# Patient Record
Sex: Female | Born: 1965 | Race: White | Hispanic: No | Marital: Married | Smoking: Never smoker
Health system: Southern US, Community
[De-identification: ages and names within clinical notes are randomized; demographics above are authoritative.]

## PROBLEM LIST (undated history)

## (undated) DIAGNOSIS — C4490 Unspecified malignant neoplasm of skin, unspecified: Secondary | ICD-10-CM

## (undated) HISTORY — PX: TUBAL LIGATION: SHX77

## (undated) HISTORY — PX: TONSILLECTOMY: SUR1361

---

## 2006-01-13 ENCOUNTER — Ambulatory Visit: Payer: Self-pay | Admitting: General Practice

## 2009-01-24 ENCOUNTER — Ambulatory Visit: Payer: Self-pay | Admitting: General Practice

## 2018-10-12 ENCOUNTER — Encounter: Payer: Self-pay | Admitting: Certified Nurse Midwife

## 2018-10-12 ENCOUNTER — Ambulatory Visit (INDEPENDENT_AMBULATORY_CARE_PROVIDER_SITE_OTHER): Payer: BLUE CROSS/BLUE SHIELD | Admitting: Certified Nurse Midwife

## 2018-10-12 ENCOUNTER — Other Ambulatory Visit (HOSPITAL_COMMUNITY)
Admission: RE | Admit: 2018-10-12 | Discharge: 2018-10-12 | Disposition: A | Discharge status: Home / Self Care | Payer: BLUE CROSS/BLUE SHIELD | Source: Ambulatory Visit | Attending: Certified Nurse Midwife | Admitting: Certified Nurse Midwife

## 2018-10-12 VITALS — BP 132/90 | HR 75 | Ht 70.0 in | Wt 227.2 lb

## 2018-10-12 DIAGNOSIS — Z1211 Encounter for screening for malignant neoplasm of colon: Secondary | ICD-10-CM

## 2018-10-12 DIAGNOSIS — Z124 Encounter for screening for malignant neoplasm of cervix: Secondary | ICD-10-CM | POA: Diagnosis not present

## 2018-10-12 DIAGNOSIS — Z1239 Encounter for other screening for malignant neoplasm of breast: Secondary | ICD-10-CM | POA: Diagnosis not present

## 2018-10-12 DIAGNOSIS — Z01419 Encounter for gynecological examination (general) (routine) without abnormal findings: Secondary | ICD-10-CM | POA: Diagnosis present

## 2018-10-12 DIAGNOSIS — Z6832 Body mass index (BMI) 32.0-32.9, adult: Secondary | ICD-10-CM

## 2018-10-12 DIAGNOSIS — R102 Pelvic and perineal pain unspecified side: Secondary | ICD-10-CM

## 2018-10-12 NOTE — Patient Instructions (Signed)
Preventive Care 40-64 Years, Female Preventive care refers to lifestyle choices and visits with your health care provider that can promote health and wellness. What does preventive care include?  A yearly physical exam. This is also called an annual well check.  Dental exams once or twice a year.  Routine eye exams. Ask your health care provider how often you should have your eyes checked.  Personal lifestyle choices, including: ? Daily care of your teeth and gums. ? Regular physical activity. ? Eating a healthy diet. ? Avoiding tobacco and drug use. ? Limiting alcohol use. ? Practicing safe sex. ? Taking low-dose aspirin daily starting at age 58. ? Taking vitamin and mineral supplements as recommended by your health care provider. What happens during an annual well check? The services and screenings done by your health care provider during your annual well check will depend on your age, overall health, lifestyle risk factors, and family history of disease. Counseling Your health care provider may ask you questions about your:  Alcohol use.  Tobacco use.  Drug use.  Emotional well-being.  Home and relationship well-being.  Sexual activity.  Eating habits.  Work and work Statistician.  Method of birth control.  Menstrual cycle.  Pregnancy history.  Screening You may have the following tests or measurements:  Height, weight, and BMI.  Blood pressure.  Lipid and cholesterol levels. These may be checked every 5 years, or more frequently if you are over 81 years old.  Skin check.  Lung cancer screening. You may have this screening every year starting at age 78 if you have a 30-pack-year history of smoking and currently smoke or have quit within the past 15 years.  Fecal occult blood test (FOBT) of the stool. You may have this test every year starting at age 65.  Flexible sigmoidoscopy or colonoscopy. You may have a sigmoidoscopy every 5 years or a colonoscopy  every 10 years starting at age 30.  Hepatitis C blood test.  Hepatitis B blood test.  Sexually transmitted disease (STD) testing.  Diabetes screening. This is done by checking your blood sugar (glucose) after you have not eaten for a while (fasting). You may have this done every 1-3 years.  Mammogram. This may be done every 1-2 years. Talk to your health care provider about when you should start having regular mammograms. This may depend on whether you have a family history of breast cancer.  BRCA-related cancer screening. This may be done if you have a family history of breast, ovarian, tubal, or peritoneal cancers.  Pelvic exam and Pap test. This may be done every 3 years starting at age 80. Starting at age 36, this may be done every 5 years if you have a Pap test in combination with an HPV test.  Bone density scan. This is done to screen for osteoporosis. You may have this scan if you are at high risk for osteoporosis.  Discuss your test results, treatment options, and if necessary, the need for more tests with your health care provider. Vaccines Your health care provider may recommend certain vaccines, such as:  Influenza vaccine. This is recommended every year.  Tetanus, diphtheria, and acellular pertussis (Tdap, Td) vaccine. You may need a Td booster every 10 years.  Varicella vaccine. You may need this if you have not been vaccinated.  Zoster vaccine. You may need this after age 5.  Measles, mumps, and rubella (MMR) vaccine. You may need at least one dose of MMR if you were born in  1957 or later. You may also need a second dose.  Pneumococcal 13-valent conjugate (PCV13) vaccine. You may need this if you have certain conditions and were not previously vaccinated.  Pneumococcal polysaccharide (PPSV23) vaccine. You may need one or two doses if you smoke cigarettes or if you have certain conditions.  Meningococcal vaccine. You may need this if you have certain  conditions.  Hepatitis A vaccine. You may need this if you have certain conditions or if you travel or work in places where you may be exposed to hepatitis A.  Hepatitis B vaccine. You may need this if you have certain conditions or if you travel or work in places where you may be exposed to hepatitis B.  Haemophilus influenzae type b (Hib) vaccine. You may need this if you have certain conditions.  Talk to your health care provider about which screenings and vaccines you need and how often you need them. This information is not intended to replace advice given to you by your health care provider. Make sure you discuss any questions you have with your health care provider. Document Released: 01/04/2016 Document Revised: 08/27/2016 Document Reviewed: 10/09/2015 Elsevier Interactive Patient Education  2018 Elsevier Inc.  

## 2018-10-12 NOTE — Progress Notes (Signed)
ANNUAL PREVENTATIVE CARE GYN  ENCOUNTER NOTE  Subjective:       Indy Prestwood is a 52 y.o. G39P1001 female here for a routine annual gynecologic exam.  Current complaints: 1. Needs Pap smear 2. Needs mammogram 3. Need colonoscopy 4. Vaginal pressure and feeling of incomplete bladder empty for the last two years  Denies difficulty breathing or respiratory distress, chest pain, abdominal pain, excessive vaginal bleeding, dysuria, and leg pain or swelling.    Gynecologic History  Patient's last menstrual period was 09/29/2018 (exact date).  Contraception: bilateral tubal ligation  Last Pap: 2014. Results were: normal  Last mammogram: 2007. Results were: normal  Last colonoscopy: due.   Obstetric History  OB History  Gravida Para Term Preterm AB Living  1 1 1     1   SAB TAB Ectopic Multiple Live Births          1    # Outcome Date GA Lbr Len/2nd Weight Sex Delivery Anes PTL Lv  1 Term 06/09/88    F Vag-Spont   LIV    No past medical history on file.   Current Outpatient Medications on File Prior to Visit  Medication Sig Dispense Refill  . Multiple Vitamin (MULTIVITAMIN) tablet Take 1 tablet by mouth daily.     No current facility-administered medications on file prior to visit.     No Known Allergies  Social History   Socioeconomic History  . Marital status: Married    Spouse name: Not on file  . Number of children: Not on file  . Years of education: Not on file  . Highest education level: Not on file  Occupational History  . Not on file  Social Needs  . Financial resource strain: Not on file  . Food insecurity:    Worry: Not on file    Inability: Not on file  . Transportation needs:    Medical: Not on file    Non-medical: Not on file  Tobacco Use  . Smoking status: Never Smoker  . Smokeless tobacco: Never Used  Substance and Sexual Activity  . Alcohol use: Yes    Alcohol/week: 3.0 standard drinks    Types: 2 Glasses of wine, 1 Shots of liquor per  week  . Drug use: Never  . Sexual activity: Yes    Birth control/protection: Surgical  Lifestyle  . Physical activity:    Days per week: Not on file    Minutes per session: Not on file  . Stress: Not on file  Relationships  . Social connections:    Talks on phone: Not on file    Gets together: Not on file    Attends religious service: Not on file    Active member of club or organization: Not on file    Attends meetings of clubs or organizations: Not on file    Relationship status: Not on file  . Intimate partner violence:    Fear of current or ex partner: Not on file    Emotionally abused: Not on file    Physically abused: Not on file    Forced sexual activity: Not on file  Other Topics Concern  . Not on file  Social History Narrative  . Not on file    Family History  Problem Relation Age of Onset  . Breast cancer Neg Hx   . Ovarian cancer Neg Hx   . Colon cancer Neg Hx     The following portions of the patient's history were reviewed and updated as  appropriate: allergies, current medications, past family history, past medical history, past social history, past surgical history and problem list.  Review of Systems  ROS negative except as noted above. Information obtained from patient.    Objective:   BP 132/90   Pulse 75   Ht 5\' 10"  (1.778 m)   Wt 227 lb 3.2 oz (103.1 kg)   LMP 09/29/2018 (Exact Date)   BMI 32.60 kg/m   CONSTITUTIONAL: Well-developed, well-nourished female in no acute distress.   PSYCHIATRIC: Normal mood and affect. Normal behavior. Normal judgment and thought content.  NEUROLGIC: Alert and oriented to person, place, and time. Normal muscle tone coordination. No cranial nerve deficit noted.  HENT:  Normocephalic, atraumatic, External right and left ear normal. Oropharynx is clear and moist  EYES: Conjunctivae and EOM are normal. Pupils are equal and round.   NECK: Normal range of motion, supple, no masses.  Normal thyroid.   SKIN: Skin  is warm and dry. No rash noted. Not diaphoretic. No erythema. No pallor.  CARDIOVASCULAR: Normal heart rate noted, regular rhythm, no murmur.  RESPIRATORY: Clear to auscultation bilaterally. Effort and breath sounds normal, no problems with respiration noted.  BREASTS: Symmetric in size. No masses, skin changes, nipple drainage, or lymphadenopathy.  ABDOMEN: Soft, normal bowel sounds, no distention noted.  No tenderness, rebound or guarding. Obese.   PELVIC:  External Genitalia: Normal  Vagina: Normal  Cervix: Normal  Uterus: Normal  Adnexa: Normal  RV: External Exam NormaI, No Rectal Masses and Normal Sphincter tone    MUSCULOSKELETAL: Normal range of motion. No tenderness.  No cyanosis, clubbing, or edema.  2+ distal pulses.  LYMPHATIC: No Axillary, Supraclavicular, or Inguinal Adenopathy.  Assessment:   Annual gynecologic examination 52 y.o.   Contraception: bilateral tubal ligation   Obesity 1   Problem List Items Addressed This Visit    None    Visit Diagnoses    Well woman exam    -  Primary   Relevant Orders   TSH   CBC   Comprehensive metabolic panel   Lipid panel   Hemoglobin A1c   Cytology - PAP   Ambulatory referral to Gastroenterology   MM 3D SCREEN BREAST BILATERAL   Screening for cervical cancer       Relevant Orders   Cytology - PAP   Colon cancer screening       Relevant Orders   Ambulatory referral to Gastroenterology   Breast cancer screening       Relevant Orders   MM 3D SCREEN BREAST BILATERAL   Pelvic pressure in female       BMI 32.0-32.9,adult       Relevant Orders   TSH   Lipid panel   Hemoglobin A1c      Plan:   Pap: Pap Co Test  Mammogram: Ordered  Colonoscopy: referral to GI, see orders.   Labs: See orders   Routine preventative health maintenance measures emphasized: Exercise/Diet/Weight control, Tobacco Warnings, Alcohol/Substance use risks and Stress Management; see AVS  Discussed home vaginal health and bladder  management techniques. Encouraged exercises and pelvic floor therapist as needed.   Reviewed red flag symptoms and when to call.   RTC for blood pressure check. Nervous today.   RTC x 1 year for ANNUAL EXAM or sooner if needed.    Gunnar Bulla, CNM [facility], [facility] 10/12/18 10:39 AM

## 2018-10-12 NOTE — Progress Notes (Signed)
Patient c/o constant vaginal pressure x2 years, feels like she does not fully empty bladder, denies dysuria.

## 2018-10-13 LAB — CBC
HEMOGLOBIN: 13 g/dL (ref 11.1–15.9)
Hematocrit: 37.6 % (ref 34.0–46.6)
MCH: 31.6 pg (ref 26.6–33.0)
MCHC: 34.6 g/dL (ref 31.5–35.7)
MCV: 92 fL (ref 79–97)
PLATELETS: 310 10*3/uL (ref 150–450)
RBC: 4.11 x10E6/uL (ref 3.77–5.28)
RDW: 13.1 % (ref 12.3–15.4)
WBC: 6.4 10*3/uL (ref 3.4–10.8)

## 2018-10-13 LAB — LIPID PANEL
Chol/HDL Ratio: 3.1 ratio (ref 0.0–4.4)
Cholesterol, Total: 194 mg/dL (ref 100–199)
HDL: 63 mg/dL (ref >39)
LDL Calculated: 115 mg/dL — ABNORMAL HIGH (ref 0–99)
TRIGLYCERIDES: 82 mg/dL (ref 0–149)
VLDL Cholesterol Cal: 16 mg/dL (ref 5–40)

## 2018-10-13 LAB — COMPREHENSIVE METABOLIC PANEL
ALT: 13 IU/L (ref 0–32)
AST: 17 IU/L (ref 0–40)
Albumin/Globulin Ratio: 1.7 (ref 1.2–2.2)
Albumin: 4.3 g/dL (ref 3.5–5.5)
Alkaline Phosphatase: 62 IU/L (ref 39–117)
BUN/Creatinine Ratio: 10 (ref 9–23)
BUN: 10 mg/dL (ref 6–24)
Bilirubin Total: 0.4 mg/dL (ref 0.0–1.2)
CO2: 22 mmol/L (ref 20–29)
Calcium: 9 mg/dL (ref 8.7–10.2)
Chloride: 103 mmol/L (ref 96–106)
Creatinine, Ser: 1 mg/dL (ref 0.57–1.00)
GFR calc Af Amer: 75 mL/min/{1.73_m2} (ref >59)
GFR, EST NON AFRICAN AMERICAN: 65 mL/min/{1.73_m2} (ref >59)
GLUCOSE: 90 mg/dL (ref 65–99)
Globulin, Total: 2.5 g/dL (ref 1.5–4.5)
POTASSIUM: 4.2 mmol/L (ref 3.5–5.2)
Sodium: 140 mmol/L (ref 134–144)
Total Protein: 6.8 g/dL (ref 6.0–8.5)

## 2018-10-13 LAB — HEMOGLOBIN A1C
Est. average glucose Bld gHb Est-mCnc: 103 mg/dL
Hgb A1c MFr Bld: 5.2 % (ref 4.8–5.6)

## 2018-10-13 LAB — TSH: TSH: 3.53 u[IU]/mL (ref 0.450–4.500)

## 2018-10-14 ENCOUNTER — Other Ambulatory Visit: Payer: Self-pay

## 2018-10-14 DIAGNOSIS — Z1211 Encounter for screening for malignant neoplasm of colon: Secondary | ICD-10-CM

## 2018-10-14 NOTE — Progress Notes (Signed)
Please contact patient. All labs normal. LDL slightly elevated. Recommend low cholesterol diet. Will contact when Pap results available. Encourage to activate MyChart. Schdule blood pressure check. Thanks, JML

## 2018-10-15 LAB — CYTOLOGY - PAP
DIAGNOSIS: NEGATIVE
HPV (WINDOPATH): NOT DETECTED

## 2018-10-18 NOTE — Progress Notes (Signed)
Please contact patient Pap negative for intraepithelial lesions and malignancy or normal. Negative for HPV as well. Next Pap in 5 years or sooner if desired. Encourage to activate MyChart. Thanks, JML

## 2018-11-11 ENCOUNTER — Ambulatory Visit: Payer: BLUE CROSS/BLUE SHIELD | Admitting: Certified Registered Nurse Anesthetist

## 2018-11-11 ENCOUNTER — Encounter
Admission: RE | Disposition: A | Discharge status: Home / Self Care | Payer: Self-pay | Source: Ambulatory Visit | Attending: Gastroenterology

## 2018-11-11 ENCOUNTER — Encounter: Payer: Self-pay | Admitting: Certified Registered Nurse Anesthetist

## 2018-11-11 ENCOUNTER — Ambulatory Visit
Admission: RE | Admit: 2018-11-11 | Discharge: 2018-11-11 | Disposition: A | Discharge status: Home / Self Care | Payer: BLUE CROSS/BLUE SHIELD | Source: Ambulatory Visit | Attending: Gastroenterology | Admitting: Gastroenterology

## 2018-11-11 DIAGNOSIS — Z1211 Encounter for screening for malignant neoplasm of colon: Secondary | ICD-10-CM

## 2018-11-11 DIAGNOSIS — K5730 Diverticulosis of large intestine without perforation or abscess without bleeding: Secondary | ICD-10-CM | POA: Diagnosis not present

## 2018-11-11 DIAGNOSIS — Z79899 Other long term (current) drug therapy: Secondary | ICD-10-CM | POA: Diagnosis not present

## 2018-11-11 HISTORY — PX: COLONOSCOPY WITH PROPOFOL: SHX5780

## 2018-11-11 LAB — POCT PREGNANCY, URINE: Preg Test, Ur: NEGATIVE

## 2018-11-11 SURGERY — COLONOSCOPY WITH PROPOFOL
Anesthesia: General

## 2018-11-11 MED ORDER — PROPOFOL 500 MG/50ML IV EMUL
INTRAVENOUS | Status: DC | PRN
  Administered 2018-11-11: 175 ug/kg/min via INTRAVENOUS

## 2018-11-11 MED ORDER — LIDOCAINE HCL (CARDIAC) PF 100 MG/5ML IV SOSY
PREFILLED_SYRINGE | INTRAVENOUS | Status: DC | PRN
  Administered 2018-11-11: 50 mg via INTRAVENOUS

## 2018-11-11 MED ORDER — SODIUM CHLORIDE 0.9 % IV SOLN
INTRAVENOUS | Status: DC
  Administered 2018-11-11: 1000 mL via INTRAVENOUS

## 2018-11-11 MED ORDER — LIDOCAINE HCL (PF) 2 % IJ SOLN
INTRAMUSCULAR | Status: AC
  Filled 2018-11-11: qty 10

## 2018-11-11 MED ORDER — PROPOFOL 10 MG/ML IV BOLUS
INTRAVENOUS | Status: DC | PRN
  Administered 2018-11-11: 20 mg via INTRAVENOUS
  Administered 2018-11-11: 80 mg via INTRAVENOUS

## 2018-11-11 NOTE — Anesthesia Post-op Follow-up Note (Signed)
Anesthesia QCDR form completed.        

## 2018-11-11 NOTE — Anesthesia Postprocedure Evaluation (Signed)
Anesthesia Post Note  Patient: Morgan Summers  Procedure(s) Performed: COLONOSCOPY WITH PROPOFOL (N/A )  Patient location during evaluation: Endoscopy Anesthesia Type: General Level of consciousness: awake and alert Pain management: pain level controlled Vital Signs Assessment: post-procedure vital signs reviewed and stable Respiratory status: spontaneous breathing, nonlabored ventilation, respiratory function stable and patient connected to nasal cannula oxygen Cardiovascular status: blood pressure returned to baseline and stable Postop Assessment: no apparent nausea or vomiting Anesthetic complications: no     Last Vitals:  Vitals:   11/11/18 0854 11/11/18 0924  BP: 104/64 118/76  Pulse: 72   Resp: 13   Temp: (!) 36.3 C   SpO2: 97%     Last Pain:  Vitals:   11/11/18 0924  TempSrc:   PainSc: 0-No pain                 Cleda MccreedyJoseph K Piscitello

## 2018-11-11 NOTE — Anesthesia Procedure Notes (Signed)
Date/Time: 11/11/2018 8:37 AM Performed by: Ginger CarneMichelet, Perian Tedder, CRNA Pre-anesthesia Checklist: Patient identified, Emergency Drugs available, Suction available, Patient being monitored and Timeout performed Patient Re-evaluated:Patient Re-evaluated prior to induction Oxygen Delivery Method: Nasal cannula Preoxygenation: Pre-oxygenation with 100% oxygen Induction Type: IV induction

## 2018-11-11 NOTE — H&P (Signed)
Arlyss Repressohini R , MD [address]  [address]  [place], [place] [address]  Main: [phone]  Fax: [phone] Pager: [phone]  Primary Care Physician:  System, Pcp Not In Primary Gastroenterologist:  Dr. Arlyss Repressohini R   Pre-Procedure History & Physical: HPI:  Morgan Summers is a 52 y.o. female is here for an colonoscopy.   History reviewed. No pertinent past medical history.  Past Surgical History:  Procedure Laterality Date  . TONSILLECTOMY    . TUBAL LIGATION      Prior to Admission medications   Medication Sig Start Date End Date Taking? Authorizing Provider  Multiple Vitamin (MULTIVITAMIN) tablet Take 1 tablet by mouth daily.   Yes [provider]    Allergies as of 10/14/2018  . (No Known Allergies)    Family History  Problem Relation Age of Onset  . Breast cancer Neg Hx   . Ovarian cancer Neg Hx   . Colon cancer Neg Hx     Social History   Socioeconomic History  . Marital status: Married    Spouse name: Not on file  . Number of children: Not on file  . Years of education: Not on file  . Highest education level: Not on file  Occupational History  . Not on file  Social Needs  . Financial resource strain: Not on file  . Food insecurity:    Worry: Not on file    Inability: Not on file  . Transportation needs:    Medical: Not on file    Non-medical: Not on file  Tobacco Use  . Smoking status: Never Smoker  . Smokeless tobacco: Never Used  Substance and Sexual Activity  . Alcohol use: Yes    Alcohol/week: 3.0 standard drinks    Types: 2 Glasses of wine, 1 Shots of liquor per week  . Drug use: Never  . Sexual activity: Yes    Birth control/protection: Surgical  Lifestyle  . Physical activity:    Days per week: Not on file    Minutes per session: Not on file  . Stress: Not on file  Relationships  . Social connections:    Talks on phone: Not on file    Gets together: Not on file    Attends religious service: Not on file   Active member of club or organization: Not on file    Attends meetings of clubs or organizations: Not on file    Relationship status: Not on file  . Intimate partner violence:    Fear of current or ex partner: Not on file    Emotionally abused: Not on file    Physically abused: Not on file    Forced sexual activity: Not on file  Other Topics Concern  . Not on file  Social History Narrative  . Not on file    Review of Systems: See HPI, otherwise negative ROS  Physical Exam: BP 127/79   Pulse 85   Temp (!) 96.7 F (35.9 C) (Tympanic)   Resp 16   Ht 5\' 10"  (1.778 m)   Wt 99.8 kg   SpO2 100%   BMI 31.57 kg/m  General:   Alert,  pleasant and cooperative in NAD Head:  Normocephalic and atraumatic. Neck:  Supple; no masses or thyromegaly. Lungs:  Clear throughout to auscultation.    Heart:  Regular rate and rhythm. Abdomen:  Soft, nontender and nondistended. Normal bowel sounds, without guarding, and without rebound.   Neurologic:  Alert and  oriented x4;  grossly normal neurologically.  Impression/Plan: Morgan Summers is here for an colonoscopy to be performed for colon cancer screening  Risks, benefits, limitations, and alternatives regarding  colonoscopy have been reviewed with the patient.  Questions have been answered.  All parties agreeable.   Lannette Donath, MD  11/11/2018, 8:34 AM

## 2018-11-11 NOTE — Anesthesia Preprocedure Evaluation (Signed)
Anesthesia Evaluation  Patient identified by MRN, date of birth, ID band Patient awake    Reviewed: Allergy & Precautions, H&P , NPO status , Patient's Chart, lab work & pertinent test results  History of Anesthesia Complications Negative for: history of anesthetic complications  Airway Mallampati: III  TM Distance: >3 FB Neck ROM: full    Dental  (+) Chipped   Pulmonary neg pulmonary ROS, neg shortness of breath,           Cardiovascular Exercise Tolerance: Good (-) angina(-) Past MI and (-) DOE negative cardio ROS       Neuro/Psych negative neurological ROS  negative psych ROS   GI/Hepatic negative GI ROS, Neg liver ROS, neg GERD  ,  Endo/Other  negative endocrine ROS  Renal/GU negative Renal ROS  negative genitourinary   Musculoskeletal   Abdominal   Peds  Hematology negative hematology ROS (+)   Anesthesia Other Findings History reviewed. No pertinent past medical history.  Past Surgical History: No date: TONSILLECTOMY No date: TUBAL LIGATION  BMI    Body Mass Index:  31.57 kg/m      Reproductive/Obstetrics negative OB ROS                             Anesthesia Physical Anesthesia Plan  ASA: II  Anesthesia Plan: General   Post-op Pain Management:    Induction: Intravenous  PONV Risk Score and Plan: Propofol infusion and TIVA  Airway Management Planned: Natural Airway and Nasal Cannula  Additional Equipment:   Intra-op Plan:   Post-operative Plan:   Informed Consent: I have reviewed the patients History and Physical, chart, labs and discussed the procedure including the risks, benefits and alternatives for the proposed anesthesia with the patient or authorized representative who has indicated his/her understanding and acceptance.   Dental Advisory Given  Plan Discussed with: Anesthesiologist, CRNA and Surgeon  Anesthesia Plan Comments: (Patient consented  for risks of anesthesia including but not limited to:  - adverse reactions to medications - risk of intubation if required - damage to teeth, lips or other oral mucosa - sore throat or hoarseness - Damage to heart, brain, lungs or loss of life  Patient voiced understanding.)        Anesthesia Quick Evaluation

## 2018-11-11 NOTE — Transfer of Care (Signed)
Immediate Anesthesia Transfer of Care Note  Patient: Morgan Summers  Procedure(s) Performed: COLONOSCOPY WITH PROPOFOL (N/A )  Patient Location: PACU  Anesthesia Type:General  Level of Consciousness: drowsy  Airway & Oxygen Therapy: Patient Spontanous Breathing  Post-op Assessment: Report given to RN and Post -op Vital signs reviewed and stable  Post vital signs: Reviewed and stable  Last Vitals:  Vitals Value Taken Time  BP 104/64 11/11/2018  8:55 AM  Temp 36.3 C 11/11/2018  8:54 AM  Pulse 73 11/11/2018  8:55 AM  Resp 12 11/11/2018  8:55 AM  SpO2 97 % 11/11/2018  8:55 AM  Vitals shown include unvalidated device data.  Last Pain:  Vitals:   11/11/18 0854  TempSrc: Tympanic  PainSc: Asleep         Complications: No apparent anesthesia complications

## 2018-11-11 NOTE — Op Note (Addendum)
[facility] Gastroenterology Patient Name: Morgan Summers Procedure Date: 11/11/2018 7:48 AM MRN: [id number] Account #: [id number] Date of Birth: 1966-07-26 Admit Type: Outpatient Age: 52 Room: [facility] ENDO ROOM 2 Gender: Female Note Status: Finalized Procedure:            Colonoscopy Indications:          Screening for colorectal malignant neoplasm, This is                        the patient's first colonoscopy Providers:            Lin Landsman MD, MD Referring MD:         No Local Md, MD (Referring MD) Medicines:            Monitored Anesthesia Care Complications:        No immediate complications. Estimated blood loss: None. Procedure:            Pre-Anesthesia Assessment:                       - Prior to the procedure, a History and Physical was                        performed, and patient medications and allergies were                        reviewed. The patient is competent. The risks and                        benefits of the procedure and the sedation options and                        risks were discussed with the patient. All questions                        were answered and informed consent was obtained.                        Patient identification and proposed procedure were                        verified by the physician, the nurse, the                        anesthesiologist, the anesthetist and the technician in                        the pre-procedure area in the procedure room in the                        endoscopy suite. Mental Status Examination: alert and                        oriented. Airway Examination: normal oropharyngeal                        airway and neck mobility. Respiratory Examination:                        clear to auscultation. CV Examination: normal.  Prophylactic Antibiotics: The patient does not require                        prophylactic antibiotics. Prior Anticoagulants: The           patient has taken no previous anticoagulant or                        antiplatelet agents. ASA Grade Assessment: II - A                        patient with mild systemic disease. After reviewing the                        risks and benefits, the patient was deemed in                        satisfactory condition to undergo the procedure. The                        anesthesia plan was to use monitored anesthesia care                        (MAC). Immediately prior to administration of                        medications, the patient was re-assessed for adequacy                        to receive sedatives. The heart rate, respiratory rate,                        oxygen saturations, blood pressure, adequacy of                        pulmonary ventilation, and response to care were                        monitored throughout the procedure. The physical status                        of the patient was re-assessed after the procedure.                       After obtaining informed consent, the colonoscope was                        passed under direct vision. Throughout the procedure,                        the patient's blood pressure, pulse, and oxygen                        saturations were monitored continuously. The                        Colonoscope was introduced through the anus and                        advanced to the the cecum, identified by appendiceal  orifice and ileocecal valve. The colonoscopy was                        somewhat difficult due to multiple diverticula in the                        colon. The patient tolerated the procedure well. The                        quality of the bowel preparation was evaluated using                        the BBPS [facility] Bowel Preparation Scale) with scores                        of: Right Colon = 3, Transverse Colon = 3 and Left                        Colon = 3 (entire mucosa seen well with no residual                         staining, small fragments of stool or opaque liquid).                        The total BBPS score equals 9. Findings:      The perianal and digital rectal examinations were normal. Pertinent       negatives include normal sphincter tone and no palpable rectal lesions.      Multiple diverticula were found in the sigmoid colon and distal       descending colon. There was no evidence of diverticular bleeding.      Normal mucosa was found in the entire colon.      The exam was otherwise without abnormality.      The retroflexed view of the distal rectum and anal verge was normal and       showed no anal or rectal abnormalities. Impression:           - Severe diverticulosis in the sigmoid colon and in the                        distal descending colon. There was no evidence of                        diverticular bleeding.                       - Normal mucosa in the entire examined colon.                       - The examination was otherwise normal.                       - The distal rectum and anal verge are normal on                        retroflexion view.                       - No specimens collected. Recommendation:       -  Discharge patient to home (with spouse).                       - Resume previous diet today.                       - Continue present medications.                       - Repeat colonoscopy in 10 years for surveillance. Procedure Code(s):    --- Professional ---                       G2563, Colorectal cancer screening; colonoscopy on                        individual not meeting criteria for high risk Diagnosis Code(s):    --- Professional ---                       Z12.11, Encounter for screening for malignant neoplasm                        of colon                       K57.30, Diverticulosis of large intestine without                        perforation or abscess without bleeding CPT copyright 2018 American Medical Association. All rights  reserved. The codes documented in this report are preliminary and upon coder review may  be revised to meet current compliance requirements. Dr. Ulyess Mort Lin Landsman MD, MD 11/11/2018 8:53:17 AM This report has been signed electronically. Number of Addenda: 0 Note Initiated On: 11/11/2018 7:48 AM Scope Withdrawal Time: 0 hours 6 minutes 52 seconds  Total Procedure Duration: 0 hours 10 minutes 38 seconds       [facility]

## 2018-11-12 ENCOUNTER — Encounter: Payer: Self-pay | Admitting: Gastroenterology

## 2018-11-17 ENCOUNTER — Ambulatory Visit
Admission: RE | Admit: 2018-11-17 | Discharge: 2018-11-17 | Disposition: A | Discharge status: Home / Self Care | Payer: BLUE CROSS/BLUE SHIELD | Source: Ambulatory Visit | Attending: Certified Nurse Midwife | Admitting: Certified Nurse Midwife

## 2018-11-17 DIAGNOSIS — Z1239 Encounter for other screening for malignant neoplasm of breast: Secondary | ICD-10-CM | POA: Diagnosis present

## 2018-11-17 DIAGNOSIS — Z01419 Encounter for gynecological examination (general) (routine) without abnormal findings: Secondary | ICD-10-CM | POA: Insufficient documentation

## 2019-06-15 IMAGING — MG DIGITAL SCREENING BILATERAL MAMMOGRAM WITH TOMO AND CAD
8 series · 8 of 24 positions shown · non-contrast
Comparison: Film screen mammogram 01/13/2006.

CLINICAL DATA: Screening.

EXAM:
DIGITAL SCREENING BILATERAL MAMMOGRAM WITH TOMO AND CAD

[R CC synth-2D]
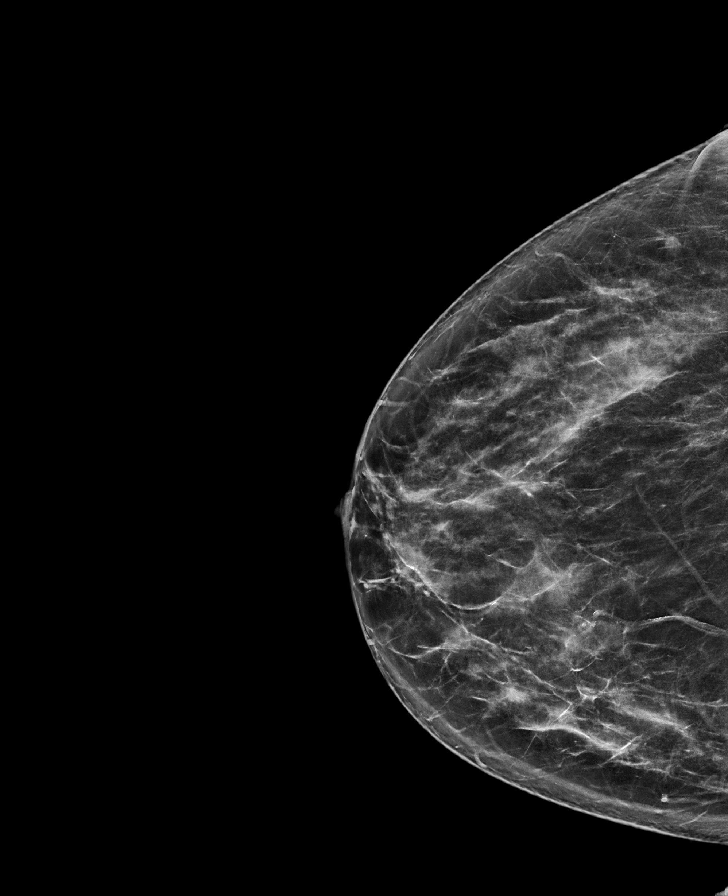

[L CC synth-2D]
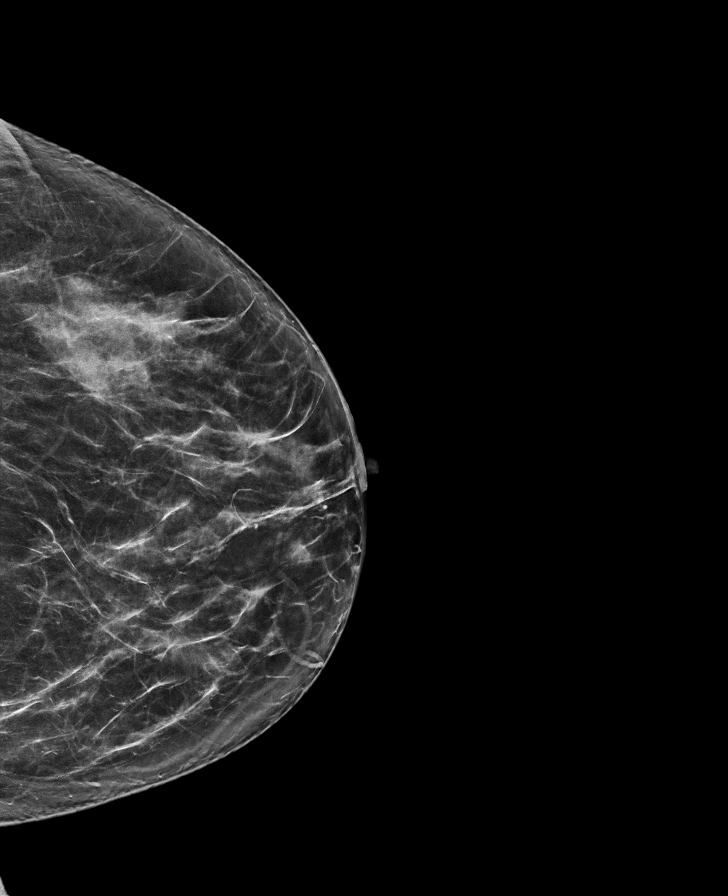

[L MLO synth-2D]
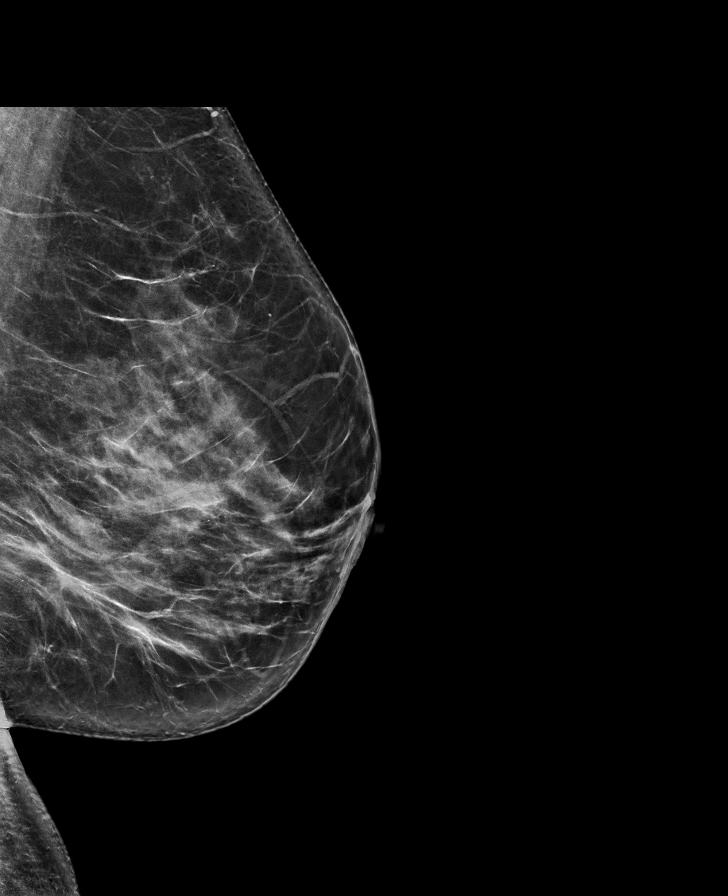

[R MLO synth-2D]
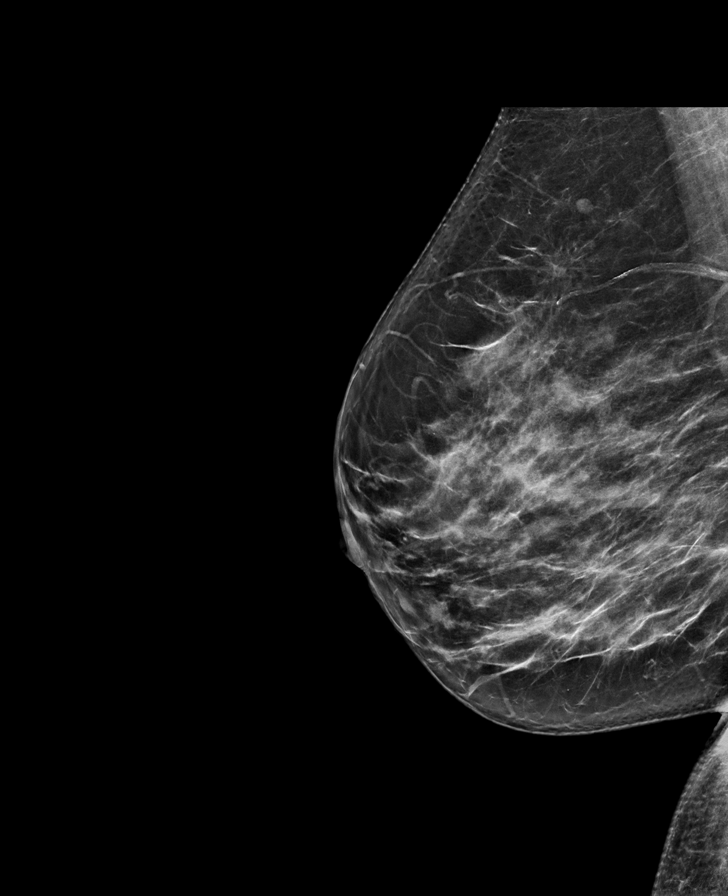

[R CC tomo · tomo slice 38/75.0]
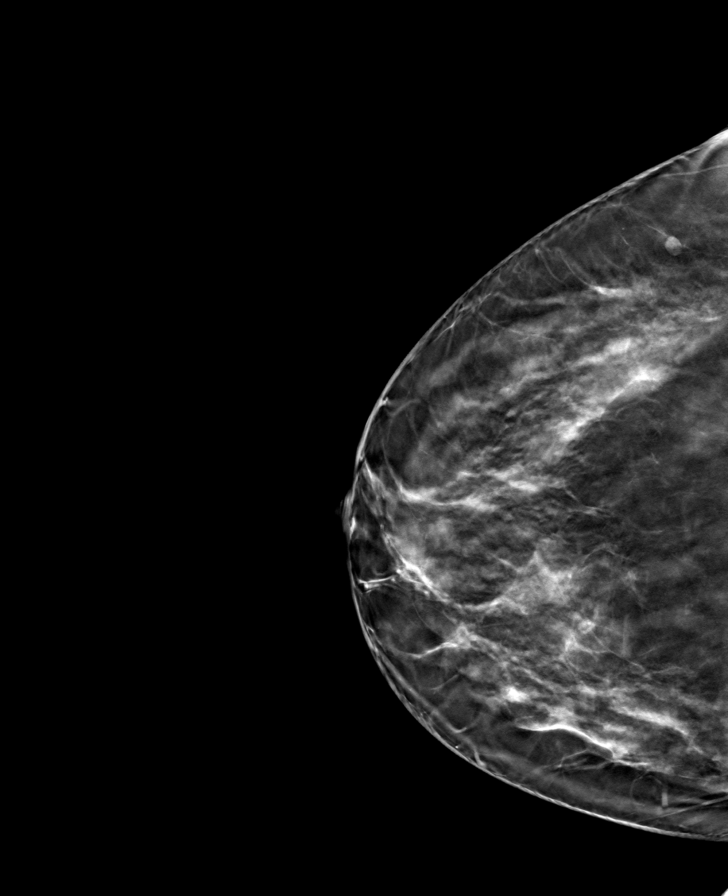

[R MLO tomo · tomo slice 38/75.0]
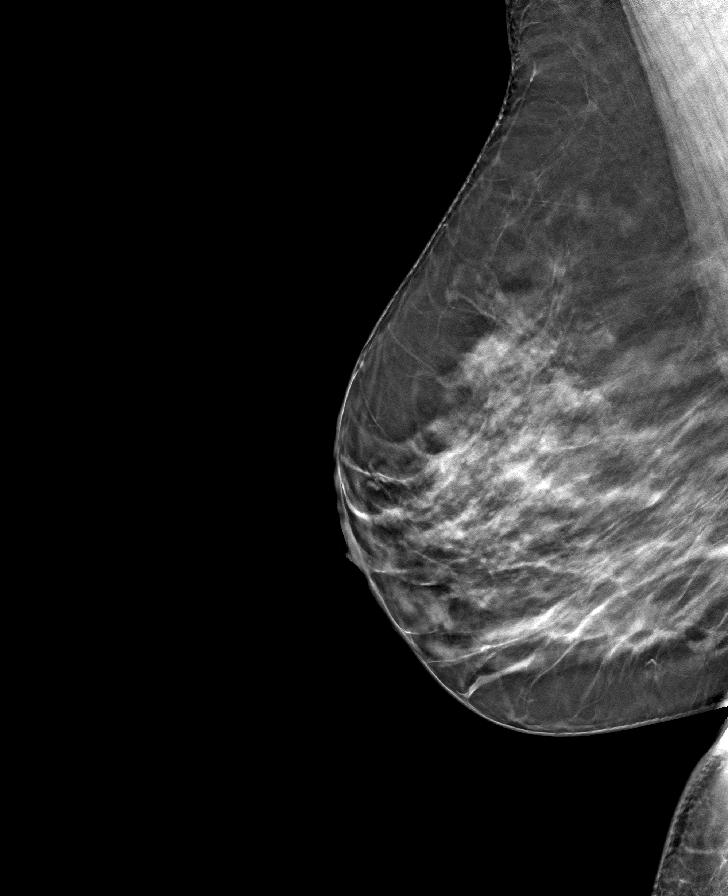

[L CC tomo · tomo slice 39/77.0]
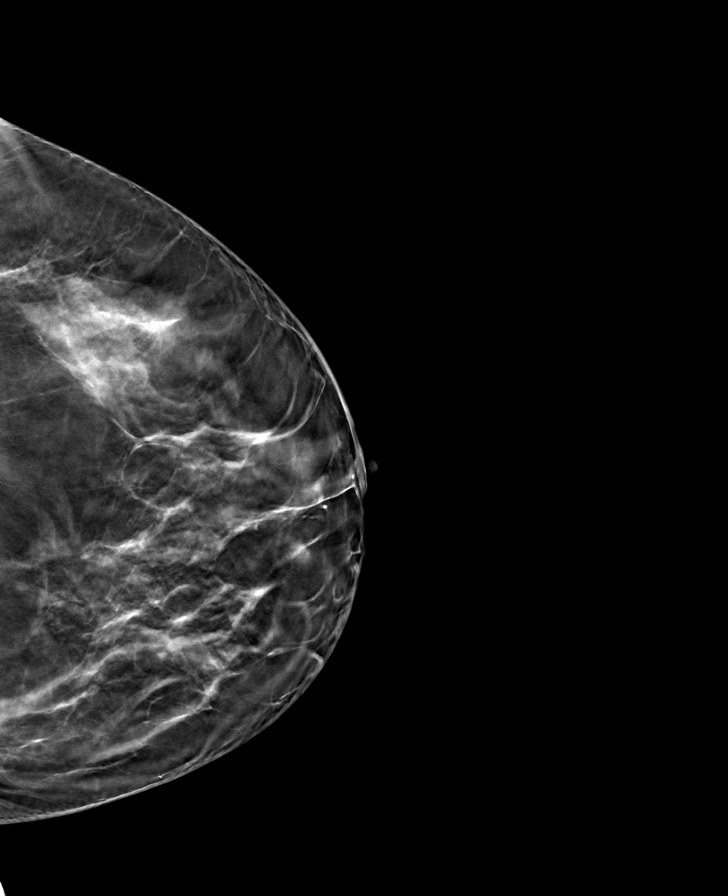

[L MLO tomo · tomo slice 41/80.0]
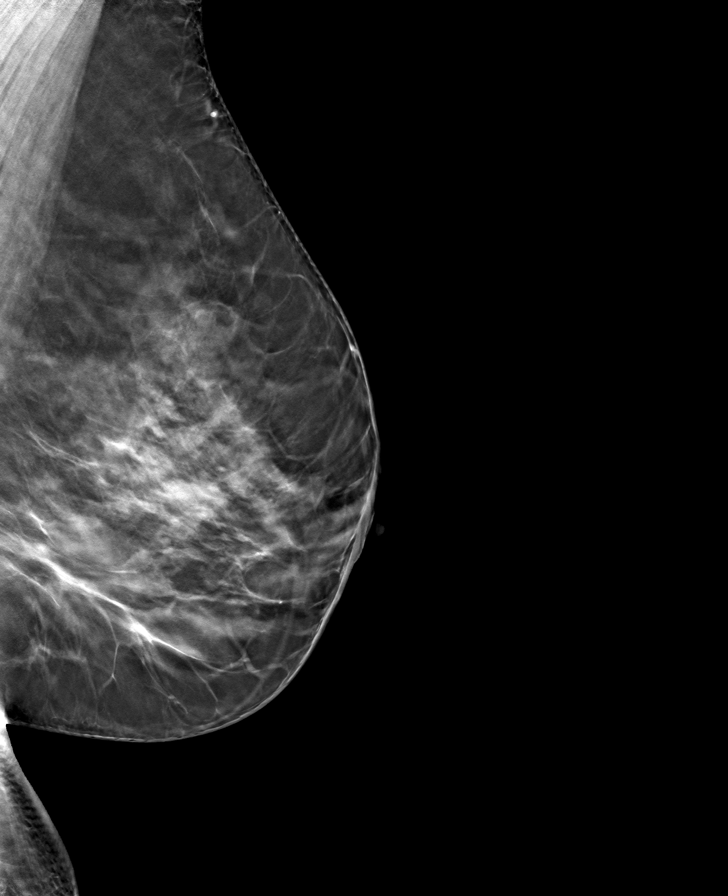

[8 of 24 positions shown; findings below may reference images not displayed]

ACR Breast Density Category c: The breast tissue is heterogeneously
dense, which may obscure small masses.
FINDINGS: There are no findings suspicious for malignancy. Images were
processed with CAD.
IMPRESSION: No mammographic evidence of malignancy. A result letter of this
screening mammogram will be mailed directly to the patient.

RECOMMENDATION:
Screening mammogram in one year. (Code:[id number])

BI-RADS CATEGORY  1: Negative.

## 2019-09-22 ENCOUNTER — Other Ambulatory Visit: Payer: Self-pay

## 2019-09-22 ENCOUNTER — Encounter: Payer: Self-pay | Admitting: Internal Medicine

## 2019-09-22 ENCOUNTER — Ambulatory Visit: Payer: 59 | Admitting: Internal Medicine

## 2019-09-22 VITALS — BP 127/76 | HR 78 | Temp 98.2°F | Resp 12 | Ht 70.0 in | Wt 232.0 lb

## 2019-09-22 DIAGNOSIS — M654 Radial styloid tenosynovitis [de Quervain]: Secondary | ICD-10-CM | POA: Insufficient documentation

## 2019-09-22 DIAGNOSIS — M25539 Pain in unspecified wrist: Secondary | ICD-10-CM | POA: Insufficient documentation

## 2019-09-22 DIAGNOSIS — Z23 Encounter for immunization: Secondary | ICD-10-CM

## 2019-09-22 DIAGNOSIS — M25531 Pain in right wrist: Secondary | ICD-10-CM

## 2019-09-22 NOTE — Progress Notes (Signed)
Bilateral wrist pain ongoing for several months.  Mainly 4th finger & thumbs on both hands sore 1st thing in the morning. No known injury. No regular use of anti-inflammatories.  AMD

## 2019-09-22 NOTE — Progress Notes (Signed)
S - 53 y.o. white female who presents with bilat wrist pain  Present for months and after googling and talked to friend with carpal tunnel and concerned if not get looked at and taken care of, could cause longer term problems Pain felt along the thumb region and at times at the base of the fourth finger. 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  It is more problematic on the right and she is right handed No major swelling No one time trauma before,  Is very active with her hands and typing, and does try to have good ergonometry when asked (works from home) Has not tried any meds to help,  Not try ice application Not dropping things, no numbness or tingling noted, describes sometimes as feeling more stiff and she works it out   No major injury to the wrist in past with no prior fracture or surgery   Current Outpatient Medications on File Prior to Visit  Medication Sig Dispense Refill  . Multiple Vitamin (MULTIVITAMIN) tablet Take 1 tablet by mouth daily.     No current facility-administered medications on file prior to visit.      No Known Allergies   O - NAD, masked  BP 127/76 (BP Location: Right Arm, Patient Position: Sitting, Cuff Size: Large)   Pulse 78   Temp 98.2 F (36.8 C) (Oral)   Resp 12   Ht 5\' 10"  (1.778 m)   Wt 232 lb (105.2 kg)   SpO2 97%   BMI 33.29 kg/m    UE-  Wrist - good ROM bilat:  flexion and extension not limited, feels more stiff than painful with testing   Supination and pronation not limited, no marked increased discomfort with resistance testing, noted may start to feel if persistent or repetitive   Ulnar and radial deviation not limited, no marked discomfort with testing, again noted may start to bother her if continued  No swelling bilat  Squeeze test neg bilat  Tender to palpation on the radial side of the wrist at the distal radial styloid and extends up  towards the 1st MC and slightly proximal, right > left  Good grip strength with some discomfort with grip on right>>>left  Tinel's sign neg bilat  Phelan's neg bilat  Finklesteins mildly positive right > left  Sensation intact in LT in forearm and hand bilat  Pulses intact bilat No increased erythema, warmth over joints of the hand , NT at 4th MCP joint bilat and no swelling.   Ass - probable tenosynovitis - wrist (DeQuervain's), feel less likely carpal tunnel clinically but remains in differential, not feel intersection syndrome. May have a mild element of arthritis noted in the hands. Patient admits not all that painful nor limiting at present.   Plan - Educated on dx, information provided in AVS about this dx  RICE modalities reviewed   Ibuprofen or aleve (or generic naproxen) - prn short term, not use routinely  Activity modifications in the short term   Soft wrist restraint rec'ed - use overnight when sleeping and during day when active for first couple weeks and then if better, can wean from splint and assess (not like to keep splinted too long as can cause weakness/stiffness).  F/u in 4 weeks at the latest, sooner prn.  Patient was made aware that I will not be the provider here for her f/u as I will not be  available to see patients in this clinic after this week.

## 2019-09-22 NOTE — Patient Instructions (Signed)
De Quervain's Tenosynovitis  De Quervain's tenosynovitis is a condition that causes inflammation of the tendon on the thumb side of the wrist. Tendons are cords of tissue that connect bones to muscles. The tendons in the hand pass through a tunnel called a sheath. A slippery layer of tissue (synovium) lets the tendons move smoothly in the sheath. With de Quervain's tenosynovitis, the sheath swells or thickens, causing friction and pain. The condition is also called de Quervain's disease and de Quervain's syndrome. It occurs most often in women who are 30-50 years old. What are the causes? The exact cause of this condition is not known. It may be associated with overuse of the hand and wrist. What increases the risk? You are more likely to develop this condition if you:  Use your hands far more than normal, especially if you repeat certain movements that involve twisting your hand or using a tight grip.  Are pregnant.  Are a middle-aged woman.  Have rheumatoid arthritis.  Have diabetes. What are the signs or symptoms? The main symptom of this condition is pain on the thumb side of the wrist. The pain may get worse when you grasp something or turn your wrist. Other symptoms may include:  Pain that extends up the forearm.  Swelling of your wrist and hand.  Trouble moving the thumb and wrist.  A sensation of snapping in the wrist.  A bump filled with fluid (cyst) in the area of the pain. How is this diagnosed? This condition may be diagnosed based on:  Your symptoms and medical history.  A physical exam. During the exam, your health care provider may do a simple test (Finkelstein test) that involves pulling your thumb and wrist to see if this causes pain. You may also need to have an X-ray. How is this treated? Treatment for this condition may include:  Avoiding any activity that causes pain and swelling.  Taking medicines. Anti-inflammatory medicines and corticosteroid  injections may be used to reduce inflammation and relieve pain.  Wearing a splint.  Having surgery. This may be needed if other treatments do not work. Once the pain and swelling has gone down:  Physical therapy. This includes stretching and strengthening exercises.  Occupational therapy. This includes adjusting how you move your wrist. Follow these instructions at home: If you have a splint:  Wear the splint as told by your health care provider. Remove it only as told by your health care provider.  Loosen the splint if your fingers tingle, become numb, or turn cold and blue.  Keep the splint clean.  If the splint is not waterproof: ? Do not let it get wet. ? Cover it with a watertight covering when you take a bath or a shower. Managing pain, stiffness, and swelling   Avoid movements and activities that cause pain and swelling in the wrist area.  If directed, put ice on the painful area. This may be helpful after doing activities that involve the sore wrist. ? Put ice in a plastic bag. ? Place a towel between your skin and the bag. ? Leave the ice on for 20 minutes, 2-3 times a day.  Move your fingers often to avoid stiffness and to lessen swelling.  Raise (elevate) the injured area above the level of your heart while you are sitting or lying down. General instructions  Return to your normal activities as told by your health care provider. Ask your health care provider what activities are safe for you.  Take over-the-counter   and prescription medicines only as told by your health care provider.  Keep all follow-up visits as told by your health care provider. This is important. Contact a health care provider if:  Your pain medicine does not help.  Your pain gets worse.  You develop new symptoms. Summary  De Quervain's tenosynovitis is a condition that causes inflammation of the tendon on the thumb side of the wrist.  The condition occurs most often in women who are  30-50 years old.  The exact cause of this condition is not known. It may be associated with overuse of the hand and wrist.  Treatment starts with avoiding activity that causes pain or swelling in the wrist area. Other treatment may include wearing a splint and taking medicine. Sometimes, surgery is needed. This information is not intended to replace advice given to you by your health care provider. Make sure you discuss any questions you have with your health care provider. Document Released: 09/02/2001 Document Revised: 06/10/2018 Document Reviewed: 11/16/2017 Elsevier Patient Education  2020 [employer]vier Inc[employer]  

## 2019-10-24 ENCOUNTER — Encounter: Payer: BLUE CROSS/BLUE SHIELD | Admitting: Certified Nurse Midwife

## 2019-10-24 DIAGNOSIS — D2271 Melanocytic nevi of right lower limb, including hip: Secondary | ICD-10-CM | POA: Diagnosis not present

## 2019-10-24 DIAGNOSIS — L570 Actinic keratosis: Secondary | ICD-10-CM | POA: Diagnosis not present

## 2019-10-24 DIAGNOSIS — L821 Other seborrheic keratosis: Secondary | ICD-10-CM | POA: Diagnosis not present

## 2019-10-24 DIAGNOSIS — D2272 Melanocytic nevi of left lower limb, including hip: Secondary | ICD-10-CM | POA: Diagnosis not present

## 2019-10-24 DIAGNOSIS — L218 Other seborrheic dermatitis: Secondary | ICD-10-CM | POA: Diagnosis not present

## 2019-10-24 DIAGNOSIS — X32XXXA Exposure to sunlight, initial encounter: Secondary | ICD-10-CM | POA: Diagnosis not present

## 2019-10-24 DIAGNOSIS — D225 Melanocytic nevi of trunk: Secondary | ICD-10-CM | POA: Diagnosis not present

## 2019-10-24 DIAGNOSIS — D2261 Melanocytic nevi of right upper limb, including shoulder: Secondary | ICD-10-CM | POA: Diagnosis not present

## 2019-10-24 DIAGNOSIS — D2262 Melanocytic nevi of left upper limb, including shoulder: Secondary | ICD-10-CM | POA: Diagnosis not present

## 2020-02-15 DIAGNOSIS — H5213 Myopia, bilateral: Secondary | ICD-10-CM | POA: Diagnosis not present

## 2020-07-24 ENCOUNTER — Ambulatory Visit
Admission: EM | Admit: 2020-07-24 | Discharge: 2020-07-24 | Disposition: A | Discharge status: Home / Self Care | Payer: 59 | Attending: Emergency Medicine | Admitting: Emergency Medicine

## 2020-07-24 ENCOUNTER — Other Ambulatory Visit: Payer: Self-pay

## 2020-07-24 DIAGNOSIS — N390 Urinary tract infection, site not specified: Secondary | ICD-10-CM | POA: Diagnosis not present

## 2020-07-24 LAB — POCT URINALYSIS DIP (MANUAL ENTRY)
Bilirubin, UA: NEGATIVE
Glucose, UA: NEGATIVE mg/dL
Ketones, POC UA: NEGATIVE mg/dL
Nitrite, UA: POSITIVE — AB
Protein Ur, POC: 100 mg/dL — AB
Spec Grav, UA: >=1.03 — AB (ref 1.010–1.025)
Urobilinogen, UA: 0.2 E.U./dL
pH, UA: 5 (ref 5.0–8.0)

## 2020-07-24 LAB — POCT URINE PREGNANCY: Preg Test, Ur: NEGATIVE

## 2020-07-24 MED ORDER — CEPHALEXIN 500 MG PO CAPS: 500.0000 mg | ORAL_CAPSULE | Freq: Two times a day (BID) | ORAL | 0 refills | Status: AC

## 2020-07-24 NOTE — ED Provider Notes (Signed)
Morgan Summers    CSN: [id number] Arrival date & time: 07/24/20  1749      History   Chief Complaint Chief Complaint  Patient presents with  . Urinary Tract Infection    HPI Morgan Summers is a 54 y.o. female.   Patient presents with 1 week history of dysuria and frequency.  She denies fever, chills, abdominal pain, back pain, vaginal discharge, pelvic pain, rash, lesions, or other symptoms.  No treatments attempted at home.  The history is provided by the patient.    History reviewed. No pertinent past medical history.  Patient Active Problem List   Diagnosis Date Noted  . Wrist pain 09/22/2019  . De Quervain's tenosynovitis, bilateral 09/22/2019  . Encounter for screening colonoscopy   . Sigmoid diverticulosis     Past Surgical History:  Procedure Laterality Date  . COLONOSCOPY WITH PROPOFOL N/A 11/11/2018   Procedure: COLONOSCOPY WITH PROPOFOL;  Surgeon: Toney Reil, MD;  Location: [facility] ENDOSCOPY;  Service: Gastroenterology;  Laterality: N/A;  . TONSILLECTOMY    . TUBAL LIGATION      OB History    Gravida  1   Para  1   Term  1   Preterm      AB      Living  1     SAB      TAB      Ectopic      Multiple      Live Births  1            Home Medications    Prior to Admission medications   Medication Sig Start Date End Date Taking? Authorizing Provider  cephALEXin (KEFLEX) 500 MG capsule Take 1 capsule (500 mg total) by mouth 2 (two) times daily for 5 days. 07/24/20 07/29/20  Mickie Bail, NP  Multiple Vitamin (MULTIVITAMIN) tablet Take 1 tablet by mouth daily.    [provider]    Family History Family History  Problem Relation Age of Onset  . Breast cancer Neg Hx   . Ovarian cancer Neg Hx   . Colon cancer Neg Hx     Social History Social History   Tobacco Use  . Smoking status: Never Smoker  . Smokeless tobacco: Never Used  Vaping Use  . Vaping Use: Never used  Substance Use Topics  . Alcohol use: Yes     Alcohol/week: 3.0 standard drinks    Types: 2 Glasses of wine, 1 Shots of liquor per week  . Drug use: Never     Allergies   Patient has no known allergies.   Review of Systems Review of Systems  Constitutional: Negative for chills and fever.  HENT: Negative for ear pain and sore throat.   Eyes: Negative for pain and visual disturbance.  Respiratory: Negative for cough and shortness of breath.   Cardiovascular: Negative for chest pain and palpitations.  Gastrointestinal: Negative for abdominal pain and vomiting.  Genitourinary: Positive for dysuria and frequency. Negative for flank pain, hematuria and vaginal discharge.  Musculoskeletal: Negative for arthralgias and back pain.  Skin: Negative for color change and rash.  Neurological: Negative for seizures and syncope.  All other systems reviewed and are negative.    Physical Exam Triage Vital Signs ED Triage Vitals [07/24/20 1757]  Enc Vitals Group     BP      Pulse      Resp      Temp      Temp src  SpO2      Weight      Height      Head Circumference      Peak Flow      Pain Score 0     Pain Loc      Pain Edu?      Excl. in GC?    No data found.  Updated Vital Signs BP 123/89 (BP Location: Right Arm)   Pulse 66   Temp 98.7 F (37.1 C)   Resp 16   SpO2 98%   Visual Acuity Right Eye Distance:   Left Eye Distance:   Bilateral Distance:    Right Eye Near:   Left Eye Near:    Bilateral Near:     Physical Exam Vitals and nursing note reviewed.  Constitutional:      General: She is not in acute distress.    Appearance: She is well-developed.  HENT:     Head: Normocephalic and atraumatic.     Mouth/Throat:     Mouth: Mucous membranes are moist.  Eyes:     Conjunctiva/sclera: Conjunctivae normal.  Cardiovascular:     Rate and Rhythm: Normal rate and regular rhythm.     Heart sounds: No murmur heard.   Pulmonary:     Effort: Pulmonary effort is normal. No respiratory distress.      Breath sounds: Normal breath sounds.  Abdominal:     Palpations: Abdomen is soft.     Tenderness: There is no abdominal tenderness. There is no right CVA tenderness, left CVA tenderness, guarding or rebound.  Musculoskeletal:     Cervical back: Neck supple.  Skin:    General: Skin is warm and dry.     Findings: No rash.  Neurological:     General: No focal deficit present.     Mental Status: She is alert and oriented to person, place, and time.     Gait: Gait normal.  Psychiatric:        Mood and Affect: Mood normal.        Behavior: Behavior normal.      UC Treatments / Results  Labs (all labs ordered are listed, but only abnormal results are displayed) Labs Reviewed  POCT URINALYSIS DIP (MANUAL ENTRY) - Abnormal; Notable for the following components:      Result Value   Clarity, UA clear (*)    Spec Grav, UA >=1.030 (*)    Blood, UA moderate (*)    Protein Ur, POC =100 (*)    Nitrite, UA Positive (*)    Leukocytes, UA Trace (*)    All other components within normal limits  URINE CULTURE  POCT URINE PREGNANCY    EKG   Radiology No results found.  Procedures Procedures (including critical care time)  Medications Ordered in UC Medications - No data to display  Initial Impression / Assessment and Plan / UC Course  I have reviewed the triage vital signs and the nursing notes.  Pertinent labs & imaging results that were available during my care of the patient were reviewed by me and considered in my medical decision making (see chart for details).   Urinary tract infection.  Urine culture pending.  Treating with Keflex.  Discussed with patient we will call her if the urine culture indicates the need to change or discontinue her antibiotic.  Instructed patient to follow-up with her PCP if her symptoms are not improving.  Patient agrees to plan of care.    Final Clinical Impressions(s) / UC  Diagnoses   Final diagnoses:  Urinary tract infection without hematuria,  site unspecified     Discharge Instructions     Take the antibiotic as directed.    The urine culture is pending.  We will call you if it indicates the need to change or discontinue your antibiotic.  Follow up with your primary care provider if your symptoms are not improving.       ED Prescriptions    Medication Sig Dispense Auth. Provider   cephALEXin (KEFLEX) 500 MG capsule Take 1 capsule (500 mg total) by mouth 2 (two) times daily for 5 days. 10 capsule Mickie Bail, NP     PDMP not reviewed this encounter.   Mickie Bail, NP 07/24/20 [phone]

## 2020-07-24 NOTE — ED Triage Notes (Addendum)
Pt presents to UC for possible UTI pt states she is having pain with urination and urinary frequency. Pt denies abdominal pain n/v/d, fevers chills, flank pain. Pt denies OTC remedies. Pt states symptoms have been present x1 week.

## 2020-07-24 NOTE — Discharge Instructions (Addendum)
Take the antibiotic as directed.    The urine culture is pending.  We will call you if it indicates the need to change or discontinue your antibiotic.  Follow up with your primary care provider if your symptoms are not improving.

## 2020-07-27 LAB — URINE CULTURE: Culture: >=100000 — AB

## 2020-08-02 ENCOUNTER — Ambulatory Visit: Payer: Self-pay | Admitting: Emergency Medicine

## 2020-08-02 ENCOUNTER — Other Ambulatory Visit: Payer: Self-pay

## 2020-08-02 ENCOUNTER — Encounter: Payer: Self-pay | Admitting: Emergency Medicine

## 2020-08-02 VITALS — BP 134/83 | HR 65 | Temp 98.3°F | Resp 14 | Ht 70.0 in | Wt 230.0 lb

## 2020-08-02 DIAGNOSIS — N390 Urinary tract infection, site not specified: Secondary | ICD-10-CM

## 2020-08-02 LAB — POCT URINALYSIS DIPSTICK
Bilirubin, UA: NEGATIVE
Glucose, UA: NEGATIVE
Ketones, UA: NEGATIVE
Nitrite, UA: POSITIVE
Protein, UA: NEGATIVE
Spec Grav, UA: 1.015 (ref 1.010–1.025)
Urobilinogen, UA: 0.2 E.U./dL
pH, UA: 6 (ref 5.0–8.0)

## 2020-08-02 MED ORDER — SULFAMETHOXAZOLE-TRIMETHOPRIM 800-160 MG PO TABS: 1.0000 | ORAL_TABLET | Freq: Two times a day (BID) | ORAL | 0 refills | Status: DC

## 2020-08-02 NOTE — Progress Notes (Signed)
Occupational Health Provider Note       Time seen: 1:08 PM    I have reviewed the vital signs and the nursing notes.  HISTORY   Chief Complaint No chief complaint on file.   HPI Morgan Summers is a 53 y.o. female with no significant past medical history who presents today for reevaluation of her urine.  Patient recently treated for UTI in urgent care placed on Keflex twice a day for 5 days.  She still has dysuria and discomfort.  Urinalysis did reveal E. coli with some resistance.  No past medical history on file.  Past Surgical History:  Procedure Laterality Date  . COLONOSCOPY WITH PROPOFOL N/A 11/11/2018   Procedure: COLONOSCOPY WITH PROPOFOL;  Surgeon: Toney Reil, MD;  Location: [facility] ENDOSCOPY;  Service: Gastroenterology;  Laterality: N/A;  . TONSILLECTOMY    . TUBAL LIGATION      Allergies Patient has no known allergies.   Review of Systems Constitutional: Negative for fever. Cardiovascular: Negative for chest pain. Respiratory: Negative for shortness of breath. Genitourinary: Positive for dysuria Musculoskeletal: Negative for back pain. Skin: Negative for rash. Neurological: Negative for headaches, focal weakness or numbness.  All systems negative/normal/unremarkable except as stated in the HPI  ____________________________________________   PHYSICAL EXAM:  VITAL SIGNS: There were no vitals filed for this visit.  Constitutional: Alert and oriented. Well appearing and in no distress. Cardiovascular: Normal rate, regular rhythm. No murmurs, rubs, or gallops. Respiratory: Normal respiratory effort without tachypnea nor retractions. Breath sounds are clear and equal bilaterally. No wheezes/rales/rhonchi. Gastrointestinal: No specific lower abdominal tenderness Musculoskeletal: Nontender with normal range of motion in extremities. No lower extremity tenderness nor edema. Neurologic:  Normal speech and language. No gross focal neurologic deficits are  appreciated.  Skin:  Skin is warm, dry and intact. No rash noted.  ____________________________________________   LABS (pertinent positives/negatives)  Recent Results (from the past 2160 hour(s))  POCT urinalysis dipstick     Status: Abnormal   Collection Time: 07/24/20  6:02 PM  Result Value Ref Range   Color, UA yellow yellow   Clarity, UA clear (A) clear   Glucose, UA negative negative mg/dL   Bilirubin, UA negative negative   Ketones, POC UA negative negative mg/dL   Spec Grav, UA >=1.540 (A) 1.010 - 1.025   Blood, UA moderate (A) negative   pH, UA 5.0 5.0 - 8.0   Protein Ur, POC =100 (A) negative mg/dL   Urobilinogen, UA 0.2 0.2 or 1.0 E.U./dL   Nitrite, UA Positive (A) Negative   Leukocytes, UA Trace (A) Negative  POCT urine pregnancy     Status: None   Collection Time: 07/24/20  6:03 PM  Result Value Ref Range   Preg Test, Ur Negative Negative  Urine Culture     Status: Abnormal   Collection Time: 07/24/20  6:08 PM   Specimen: Urine, Random  Result Value Ref Range   Specimen Description URINE, RANDOM    Special Requests      NONE Performed at [facility], [address], [place], [place] [address]    Culture >=100,000 COLONIES/mL ESCHERICHIA COLI (A)    Report Status 07/27/2020 FINAL    Organism ID, Bacteria ESCHERICHIA COLI (A)       Susceptibility   Escherichia coli - MIC*    AMPICILLIN >=32 RESISTANT Resistant     CEFAZOLIN <=4 SENSITIVE Sensitive     CEFTRIAXONE <=0.25 SENSITIVE Sensitive     CIPROFLOXACIN >=4 RESISTANT Resistant  GENTAMICIN <=1 SENSITIVE Sensitive     IMIPENEM <=0.25 SENSITIVE Sensitive     NITROFURANTOIN <=16 SENSITIVE Sensitive     TRIMETH/SULFA <=20 SENSITIVE Sensitive     AMPICILLIN/SULBACTAM >=32 RESISTANT Resistant     PIP/TAZO <=4 SENSITIVE Sensitive     * >=100,000 COLONIES/mL ESCHERICHIA COLI     ASSESSMENT AND PLAN  UTI   Plan: The patient had presented for persistent UTI symptoms.  Urine still indicating  infection, she will be placed on Septra DS for 7 days.  She can follow-up as needed.  Daryel November MD    Note: This note was generated in part or whole with voice recognition software. Voice recognition is usually quite accurate but there are transcription errors that can and very often do occur. I apologize for any typographical errors that were not detected and corrected.

## 2020-08-28 DIAGNOSIS — Z03818 Encounter for observation for suspected exposure to other biological agents ruled out: Secondary | ICD-10-CM | POA: Diagnosis not present

## 2020-08-28 DIAGNOSIS — Z20822 Contact with and (suspected) exposure to covid-19: Secondary | ICD-10-CM | POA: Diagnosis not present

## 2020-10-23 DIAGNOSIS — X32XXXA Exposure to sunlight, initial encounter: Secondary | ICD-10-CM | POA: Diagnosis not present

## 2020-10-23 DIAGNOSIS — D2261 Melanocytic nevi of right upper limb, including shoulder: Secondary | ICD-10-CM | POA: Diagnosis not present

## 2020-10-23 DIAGNOSIS — D2262 Melanocytic nevi of left upper limb, including shoulder: Secondary | ICD-10-CM | POA: Diagnosis not present

## 2020-10-23 DIAGNOSIS — Z85828 Personal history of other malignant neoplasm of skin: Secondary | ICD-10-CM | POA: Diagnosis not present

## 2020-10-23 DIAGNOSIS — D2272 Melanocytic nevi of left lower limb, including hip: Secondary | ICD-10-CM | POA: Diagnosis not present

## 2020-10-23 DIAGNOSIS — D485 Neoplasm of uncertain behavior of skin: Secondary | ICD-10-CM | POA: Diagnosis not present

## 2020-10-23 DIAGNOSIS — L821 Other seborrheic keratosis: Secondary | ICD-10-CM | POA: Diagnosis not present

## 2020-10-23 DIAGNOSIS — L218 Other seborrheic dermatitis: Secondary | ICD-10-CM | POA: Diagnosis not present

## 2020-10-23 DIAGNOSIS — L570 Actinic keratosis: Secondary | ICD-10-CM | POA: Diagnosis not present

## 2020-10-23 DIAGNOSIS — D225 Melanocytic nevi of trunk: Secondary | ICD-10-CM | POA: Diagnosis not present

## 2020-10-23 DIAGNOSIS — D0462 Carcinoma in situ of skin of left upper limb, including shoulder: Secondary | ICD-10-CM | POA: Diagnosis not present

## 2020-11-13 DIAGNOSIS — D0462 Carcinoma in situ of skin of left upper limb, including shoulder: Secondary | ICD-10-CM | POA: Diagnosis not present

## 2020-11-13 DIAGNOSIS — C44629 Squamous cell carcinoma of skin of left upper limb, including shoulder: Secondary | ICD-10-CM | POA: Diagnosis not present

## 2021-02-04 ENCOUNTER — Ambulatory Visit (INDEPENDENT_AMBULATORY_CARE_PROVIDER_SITE_OTHER): Payer: 59

## 2021-02-04 ENCOUNTER — Encounter: Payer: Self-pay | Admitting: Podiatry

## 2021-02-04 ENCOUNTER — Ambulatory Visit (INDEPENDENT_AMBULATORY_CARE_PROVIDER_SITE_OTHER): Payer: 59 | Admitting: Podiatry

## 2021-02-04 ENCOUNTER — Other Ambulatory Visit: Payer: Self-pay

## 2021-02-04 DIAGNOSIS — M722 Plantar fascial fibromatosis: Secondary | ICD-10-CM | POA: Diagnosis not present

## 2021-02-04 MED ORDER — TRIAMCINOLONE ACETONIDE 40 MG/ML IJ SUSP
20.0000 mg | Freq: Once | INTRAMUSCULAR | Status: AC
  Administered 2021-02-04: 20 mg

## 2021-02-04 MED ORDER — METHYLPREDNISOLONE 4 MG PO TBPK: ORAL_TABLET | ORAL | 0 refills | Status: DC

## 2021-02-04 MED ORDER — MELOXICAM 15 MG PO TABS: 15.0000 mg | ORAL_TABLET | Freq: Every day | ORAL | 3 refills | Status: DC

## 2021-02-04 NOTE — Progress Notes (Signed)
  Subjective:  Patient ID: Morgan Summers, female    DOB: November 08, 1966,  MRN: [id number] HPI Chief Complaint  Patient presents with  . Foot Pain    Patient presents today for right heel pain x years off and on, progressively getting worse the past year.  She says " It feel like Im walking on a rock all the time and its only when I put weight on my foot"  She has been using ice, night splint, Ibuprofen, tape and arch supports    55 y.o. female presents with the above complaint.   ROS: Denies fever chills nausea vomiting muscle aches pains calf pain back pain chest pain shortness of breath.  No past medical history on file. Past Surgical History:  Procedure Laterality Date  . COLONOSCOPY WITH PROPOFOL N/A 11/11/2018   Procedure: COLONOSCOPY WITH PROPOFOL;  Surgeon: Toney Reil, MD;  Location: [facility] ENDOSCOPY;  Service: Gastroenterology;  Laterality: N/A;  . TONSILLECTOMY    . TUBAL LIGATION      Current Outpatient Medications:  .  meloxicam (MOBIC) 15 MG tablet, Take 1 tablet (15 mg total) by mouth daily., Disp: 30 tablet, Rfl: 3 .  methylPREDNISolone (MEDROL DOSEPAK) 4 MG TBPK tablet, 6 day dose pack - take as directed, Disp: 21 tablet, Rfl: 0 .  Multiple Vitamin (MULTIVITAMIN) tablet, Take 1 tablet by mouth daily., Disp: , Rfl:   No Known Allergies Review of Systems Objective:  There were no vitals filed for this visit.  General: Well developed, nourished, in no acute distress, alert and oriented x3   Dermatological: Skin is warm, dry and supple bilateral. Nails x 10 are well maintained; remaining integument appears unremarkable at this time. There are no open sores, no preulcerative lesions, no rash or signs of infection present.  Vascular: Dorsalis Pedis artery and Posterior Tibial artery pedal pulses are 2/4 bilateral with immedate capillary fill time. Pedal hair growth present. No varicosities and no lower extremity edema present bilateral.   Neruologic: Grossly intact  via light touch bilateral. Vibratory intact via tuning fork bilateral. Protective threshold with Semmes Wienstein monofilament intact to all pedal sites bilateral. Patellar and Achilles deep tendon reflexes 2+ bilateral. No Babinski or clonus noted bilateral.   Musculoskeletal: No gross boney pedal deformities bilateral. No pain, crepitus, or limitation noted with foot and ankle range of motion bilateral. Muscular strength 5/5 in all groups tested bilateral.  Gait: Unassisted, Nonantalgic.    Radiographs:  Radiographs taken today demonstrate an osseously mature individual with a rectus foot type plantar distally oriented calcaneal heel spur that is not new.  She also has soft tissue increase in thickness of the plantar fashion calcaneal insertion site with a sharp margin consistent with a chronic proximal plantar fasciitis.  Assessment & Plan:   Assessment: Chronic proximal plantar fasciitis right.  Plan: Start her on a Medrol Dosepak to be followed by meloxicam.  Injected the right heel 20 mg Kenalog 5 mg Marcaine point of maximal tenderness.  She has plantar fascial brace dispensed and she has a night splint at home.  We discussed appropriate shoe gear stretching exercise ice therapy sugar modifications she has arch supports from the [employer].  And she wears [employer].     Talor Desrosiers T. [place], [place]

## 2021-02-04 NOTE — Patient Instructions (Signed)

## 2021-03-04 ENCOUNTER — Encounter: Payer: 59 | Admitting: Podiatry

## 2021-05-13 DIAGNOSIS — X32XXXA Exposure to sunlight, initial encounter: Secondary | ICD-10-CM | POA: Diagnosis not present

## 2021-05-13 DIAGNOSIS — L570 Actinic keratosis: Secondary | ICD-10-CM | POA: Diagnosis not present

## 2021-05-13 DIAGNOSIS — Z85828 Personal history of other malignant neoplasm of skin: Secondary | ICD-10-CM | POA: Diagnosis not present

## 2021-05-13 DIAGNOSIS — L821 Other seborrheic keratosis: Secondary | ICD-10-CM | POA: Diagnosis not present

## 2021-05-13 DIAGNOSIS — D2262 Melanocytic nevi of left upper limb, including shoulder: Secondary | ICD-10-CM | POA: Diagnosis not present

## 2021-05-13 DIAGNOSIS — D225 Melanocytic nevi of trunk: Secondary | ICD-10-CM | POA: Diagnosis not present

## 2021-05-13 DIAGNOSIS — L218 Other seborrheic dermatitis: Secondary | ICD-10-CM | POA: Diagnosis not present

## 2021-05-13 DIAGNOSIS — L814 Other melanin hyperpigmentation: Secondary | ICD-10-CM | POA: Diagnosis not present

## 2021-05-29 ENCOUNTER — Encounter: Payer: Self-pay | Admitting: Podiatry

## 2021-05-29 ENCOUNTER — Other Ambulatory Visit: Payer: Self-pay

## 2021-05-29 ENCOUNTER — Ambulatory Visit (INDEPENDENT_AMBULATORY_CARE_PROVIDER_SITE_OTHER): Payer: 59 | Admitting: Podiatry

## 2021-05-29 DIAGNOSIS — M722 Plantar fascial fibromatosis: Secondary | ICD-10-CM

## 2021-05-29 MED ORDER — MELOXICAM 15 MG PO TABS: 15.0000 mg | ORAL_TABLET | Freq: Every day | ORAL | 3 refills | Status: DC

## 2021-05-29 MED ORDER — TRIAMCINOLONE ACETONIDE 40 MG/ML IJ SUSP
20.0000 mg | Freq: Once | INTRAMUSCULAR | Status: AC
  Administered 2021-05-29: 20 mg

## 2021-05-29 NOTE — Progress Notes (Signed)
She presents today with her son as a husband for follow-up of her Planter fasciitis she states that is really not that much better but I have not seen her since February.  She states that her other foot is starting to hurt now where she slipped while they were on a cruise off of a stair.  On another cruise she slipped by the water and performed a graceful split she says.  She states that she has had Achilles pain left which was more proximal and was worse and now is subsiding slowly but is more now more distal.  Objective: Vital signs are stable she is alert oriented x3.  Pulses are palpable.  She has minimal tenderness on palpation medial calcaneal tubercle of the right heel.  Minimal tenderness on palpation of the Achilles left.  Margins appear to be normal.  Assessment: Resolving Achilles tendinitis most likely.  Planter fasciitis is much improved from previous evaluation.  Plan: Reinjected the right heel today 20 mg Kenalog 5 mg of Marcaine.  I also recommended that we consider physical therapy in the future also recommended custom built orthotics.  Discussed ice therapy stretching exercises.  And refilled her meloxicam.  Would like to follow-up with her in a few weeks if she is not improving.

## 2021-05-30 ENCOUNTER — Other Ambulatory Visit: Payer: Self-pay | Admitting: Podiatry

## 2021-05-30 NOTE — Telephone Encounter (Signed)
Please advise 

## 2021-07-17 DIAGNOSIS — Z0100 Encounter for examination of eyes and vision without abnormal findings: Secondary | ICD-10-CM | POA: Diagnosis not present

## 2021-07-17 DIAGNOSIS — H524 Presbyopia: Secondary | ICD-10-CM | POA: Diagnosis not present

## 2021-08-05 DIAGNOSIS — H02409 Unspecified ptosis of unspecified eyelid: Secondary | ICD-10-CM | POA: Diagnosis not present

## 2021-09-20 ENCOUNTER — Other Ambulatory Visit: Payer: Self-pay | Admitting: Podiatry

## 2021-09-20 NOTE — Telephone Encounter (Signed)
Please advise 

## 2021-11-27 ENCOUNTER — Ambulatory Visit: Payer: 59 | Admitting: Podiatry

## 2021-12-25 ENCOUNTER — Ambulatory Visit (INDEPENDENT_AMBULATORY_CARE_PROVIDER_SITE_OTHER): Payer: 59 | Admitting: Podiatry

## 2021-12-25 ENCOUNTER — Encounter: Payer: Self-pay | Admitting: Podiatry

## 2021-12-25 ENCOUNTER — Other Ambulatory Visit: Payer: Self-pay

## 2021-12-25 DIAGNOSIS — M722 Plantar fascial fibromatosis: Secondary | ICD-10-CM | POA: Diagnosis not present

## 2021-12-25 MED ORDER — MELOXICAM 15 MG PO TABS: 15.0000 mg | ORAL_TABLET | Freq: Every day | ORAL | 3 refills | Status: DC

## 2021-12-25 MED ORDER — TRIAMCINOLONE ACETONIDE 40 MG/ML IJ SUSP
20.0000 mg | Freq: Once | INTRAMUSCULAR | Status: AC
  Administered 2021-12-25: 20 mg

## 2021-12-25 NOTE — Progress Notes (Signed)
She presents today for follow-up of her Achilles tendinitis and Planter fasciitis right over left she states she stepped off a curb several weeks ago and heard a pop and felt a pop in the back of her left leg she states there was a knot in her Achilles but is much better now.  She states that it hurt for quite some time and they iced it and rested it.  Objective: Vital signs are stable alert and oriented x3.  She has pain on palpation to the plantar medial calcaneal tubercle of the right heel.  No reproducible pain but yet a small nodule is noted on the Achilles near the muscle insertion left foot.  Assessment: Planter fasciitis right resolving strain of the Achilles left possible tear.  Plan: Discussed etiology pathology conservative surgical therapies at this point I reinjected the plantar fashion of the right heel I refilled her meloxicam and we will follow-up with her if this does not render her asymptomatic.  I will follow-up with her on an as-needed basis otherwise

## 2022-01-14 ENCOUNTER — Other Ambulatory Visit: Payer: Self-pay | Admitting: Podiatry

## 2022-01-17 DIAGNOSIS — H02831 Dermatochalasis of right upper eyelid: Secondary | ICD-10-CM | POA: Diagnosis not present

## 2022-01-17 DIAGNOSIS — H02834 Dermatochalasis of left upper eyelid: Secondary | ICD-10-CM | POA: Diagnosis not present

## 2022-01-17 DIAGNOSIS — H57813 Brow ptosis, bilateral: Secondary | ICD-10-CM | POA: Diagnosis not present

## 2022-03-05 DIAGNOSIS — H02834 Dermatochalasis of left upper eyelid: Secondary | ICD-10-CM | POA: Diagnosis not present

## 2022-03-05 DIAGNOSIS — H02831 Dermatochalasis of right upper eyelid: Secondary | ICD-10-CM | POA: Diagnosis not present

## 2022-03-05 DIAGNOSIS — H57812 Brow ptosis, left: Secondary | ICD-10-CM | POA: Diagnosis not present

## 2022-03-05 DIAGNOSIS — H57811 Brow ptosis, right: Secondary | ICD-10-CM | POA: Diagnosis not present

## 2022-03-05 DIAGNOSIS — H57813 Brow ptosis, bilateral: Secondary | ICD-10-CM | POA: Diagnosis not present

## 2022-09-01 ENCOUNTER — Other Ambulatory Visit: Payer: Self-pay | Admitting: Podiatry

## 2023-08-07 ENCOUNTER — Other Ambulatory Visit: Payer: Self-pay | Admitting: Family Medicine

## 2023-08-07 DIAGNOSIS — Z1231 Encounter for screening mammogram for malignant neoplasm of breast: Secondary | ICD-10-CM

## 2023-08-10 ENCOUNTER — Ambulatory Visit
Admission: RE | Admit: 2023-08-10 | Discharge: 2023-08-10 | Disposition: A | Discharge status: Home / Self Care | Payer: 59 | Source: Ambulatory Visit | Attending: Family Medicine | Admitting: Family Medicine

## 2023-08-10 DIAGNOSIS — Z1231 Encounter for screening mammogram for malignant neoplasm of breast: Secondary | ICD-10-CM | POA: Diagnosis not present

## 2023-10-14 ENCOUNTER — Other Ambulatory Visit: Payer: Self-pay | Admitting: Obstetrics and Gynecology

## 2023-10-19 ENCOUNTER — Encounter: Payer: Self-pay | Admitting: Urgent Care

## 2023-10-19 ENCOUNTER — Other Ambulatory Visit: Payer: Self-pay

## 2023-10-19 ENCOUNTER — Encounter
Admission: RE | Admit: 2023-10-19 | Discharge: 2023-10-19 | Disposition: A | Discharge status: Home / Self Care | Payer: 59 | Source: Ambulatory Visit | Attending: Obstetrics and Gynecology | Admitting: Obstetrics and Gynecology

## 2023-10-19 DIAGNOSIS — Z01812 Encounter for preprocedural laboratory examination: Secondary | ICD-10-CM | POA: Diagnosis present

## 2023-10-19 DIAGNOSIS — N950 Postmenopausal bleeding: Secondary | ICD-10-CM | POA: Insufficient documentation

## 2023-10-19 HISTORY — DX: Unspecified malignant neoplasm of skin, unspecified: C44.90

## 2023-10-19 LAB — TYPE AND SCREEN
ABO/RH(D): O POS
Antibody Screen: NEGATIVE

## 2023-10-19 LAB — CBC
HCT: 41.2 % (ref 36.0–46.0)
Hemoglobin: 13.6 g/dL (ref 12.0–15.0)
MCH: 30.5 pg (ref 26.0–34.0)
MCHC: 33 g/dL (ref 30.0–36.0)
MCV: 92.4 fL (ref 80.0–100.0)
Platelets: 337 10*3/uL (ref 150–400)
RBC: 4.46 MIL/uL (ref 3.87–5.11)
RDW: 13.1 % (ref 11.5–15.5)
WBC: 6.3 10*3/uL (ref 4.0–10.5)
nRBC: 0 % (ref 0.0–0.2)

## 2023-10-19 NOTE — Patient Instructions (Addendum)
Your procedure is scheduled on: Friday 10/23/23 To find out your arrival time, please call [phone] between 1PM - 3PM on:   Thursday 10/22/23 Report to the Registration Desk on the 1st floor of the [facility]. Free Valet parking is available.  If your arrival time is 6:00 am, do not arrive before that time as the [facility] entrance doors do not open until 6:00 am.  REMEMBER: Instructions that are not followed completely may result in serious medical risk, up to and including death; or upon the discretion of your surgeon and anesthesiologist your surgery may need to be rescheduled.  Do not eat food after midnight the night before surgery.  No gum chewing or hard candies.  You may however, drink CLEAR liquids up to 2 hours before you are scheduled to arrive for your surgery. Do not drink anything within 2 hours of your scheduled arrival time.  Clear liquids include: - water  - apple juice without pulp - gatorade (not RED colors) - black coffee or tea (Do NOT add milk or creamers to the coffee or tea) Do NOT drink anything that is not on this list.  Type 1 and Type 2 diabetics should only drink water.  In addition, your doctor has ordered for you to drink the provided:  Ensure Pre-Surgery Clear Carbohydrate Drink  Drinking this carbohydrate drink up to two hours before surgery helps to reduce insulin resistance and improve patient outcomes. Please complete drinking 2 hours before scheduled arrival time.  One week prior to surgery: Stop Anti-inflammatories (NSAIDS) such as Advil, Aleve, Ibuprofen, Motrin, Naproxen, Naprosyn and Aspirin based products such as Excedrin, Goody's Powder, BC Powder. You may however, continue to take Tylenol if needed for pain up until the day of surgery.  Stop ANY OVER THE COUNTER supplements until after surgery.  Continue taking all prescribed medications.   TAKE ONLY THESE MEDICATIONS THE MORNING OF SURGERY WITH A SIP OF WATER:  none  No  Alcohol for 24 hours before or after surgery.  No Smoking including e-cigarettes for 24 hours before surgery.  No chewable tobacco products for at least 6 hours before surgery.  No nicotine patches on the day of surgery.  Do not use any "recreational" drugs for at least a week (preferably 2 weeks) before your surgery.  Please be advised that the combination of cocaine and anesthesia may have negative outcomes, up to and including death. If you test positive for cocaine, your surgery will be cancelled.  On the morning of surgery brush your teeth with toothpaste and water, you may rinse your mouth with mouthwash if you wish. Do not swallow any toothpaste or mouthwash.  Use CHG Soap or wipes as directed on instruction sheet.  Do not wear lotions, powders, or perfumes.   Do not shave body hair from the neck down 48 hours before surgery.  Wear comfortable clothing (specific to your surgery type) to the hospital.  Do not wear jewelry, make-up, hairpins, clips or nail polish.  For welded (permanent) jewelry: bracelets, anklets, waist bands, etc.  Please have this removed prior to surgery.  If it is not removed, there is a chance that hospital personnel will need to cut it off on the day of surgery. Contact lenses, hearing aids and dentures may not be worn into surgery.  Do not bring valuables to the hospital. [facility] is not responsible for any missing/lost belongings or valuables.   Notify your doctor if there is any change in your medical condition (  cold, fever, infection).  If you are being discharged the day of surgery, you will not be allowed to drive home. You will need a responsible individual to drive you home and stay with you for 24 hours after surgery.   If you are taking public transportation, you will need to have a responsible individual with you.  If you are being admitted to the hospital overnight, leave your suitcase in the car. After surgery it may be brought to your  room.  In case of increased patient census, it may be necessary for you, the patient, to continue your postoperative care in the Same Day Surgery department.  After surgery, you can help prevent lung complications by doing breathing exercises.  Take deep breaths and cough every 1-2 hours. Your doctor may order a device called an Incentive Spirometer to help you take deep breaths. When coughing or sneezing, hold a pillow firmly against your incision with both hands. This is called "splinting." Doing this helps protect your incision. It also decreases belly discomfort.  Surgery Visitation Policy:  Patients undergoing a surgery or procedure may have two family members or support persons with them as long as the person is not COVID-19 positive or experiencing its symptoms.   Inpatient Visitation:    Visiting hours are 7 a.m. to 8 p.m. Up to four visitors are allowed at one time in a patient room. The visitors may rotate out with other people during the day. One designated support person (adult) may remain overnight.  Please call the Pre-admissions Testing Dept. at [phone] if you have any questions about these instructions.     Preparing for Surgery with CHLORHEXIDINE GLUCONATE (CHG) Soap  Chlorhexidine Gluconate (CHG) Soap  o An antiseptic cleaner that kills germs and bonds with the skin to continue killing germs even after washing  o Used for showering the night before surgery and morning of surgery  Before surgery, you can play an important role by reducing the number of germs on your skin.  CHG (Chlorhexidine gluconate) soap is an antiseptic cleanser which kills germs and bonds with the skin to continue killing germs even after washing.  Please do not use if you have an allergy to CHG or antibacterial soaps. If your skin becomes reddened/irritated stop using the CHG.  1. Shower the NIGHT BEFORE SURGERY and the MORNING OF SURGERY with CHG soap.  2. If you choose to wash your  hair, wash your hair first as usual with your normal shampoo.  3. After shampooing, rinse your hair and body thoroughly to remove the shampoo.  4. Use CHG as you would any other liquid soap. You can apply CHG directly to the skin and wash gently with a scrungie or a clean washcloth.  5. Apply the CHG soap to your body only from the neck down. Do not use on open wounds or open sores. Avoid contact with your eyes, ears, mouth, and genitals (private parts). Wash face and genitals (private parts) with your normal soap.  6. Wash thoroughly, paying special attention to the area where your surgery will be performed.  7. Thoroughly rinse your body with warm water.  8. Do not shower/wash with your normal soap after using and rinsing off the CHG soap.  9. Pat yourself dry with a clean towel.  10. Wear clean pajamas to bed the night before surgery.  12. Place clean sheets on your bed the night of your first shower and do not sleep with pets.  13. Shower again  with the CHG soap on the day of surgery prior to arriving at the hospital.  14. Do not apply any deodorants/lotions/powders.  15. Please wear clean clothes to the hospital.

## 2023-10-23 ENCOUNTER — Other Ambulatory Visit: Payer: Self-pay

## 2023-10-23 ENCOUNTER — Ambulatory Visit: Payer: 59 | Admitting: Anesthesiology

## 2023-10-23 ENCOUNTER — Encounter: Payer: Self-pay | Admitting: Obstetrics and Gynecology

## 2023-10-23 ENCOUNTER — Ambulatory Visit
Admission: RE | Admit: 2023-10-23 | Discharge: 2023-10-23 | Disposition: A | Discharge status: Home / Self Care | Payer: 59 | Attending: Obstetrics and Gynecology | Admitting: Obstetrics and Gynecology

## 2023-10-23 ENCOUNTER — Encounter
Admission: RE | Disposition: A | Discharge status: Home / Self Care | Payer: Self-pay | Source: Home / Self Care | Attending: Obstetrics and Gynecology

## 2023-10-23 DIAGNOSIS — N950 Postmenopausal bleeding: Secondary | ICD-10-CM | POA: Insufficient documentation

## 2023-10-23 DIAGNOSIS — D250 Submucous leiomyoma of uterus: Secondary | ICD-10-CM | POA: Insufficient documentation

## 2023-10-23 DIAGNOSIS — Z85828 Personal history of other malignant neoplasm of skin: Secondary | ICD-10-CM | POA: Diagnosis not present

## 2023-10-23 HISTORY — PX: HYSTEROSCOPY WITH D & C: SHX1775

## 2023-10-23 LAB — ABO/RH: ABO/RH(D): O POS

## 2023-10-23 SURGERY — DILATATION AND CURETTAGE /HYSTEROSCOPY
Anesthesia: General | Site: Uterus

## 2023-10-23 MED ORDER — IBUPROFEN 600 MG PO TABS: 600.0000 mg | ORAL_TABLET | Freq: Four times a day (QID) | ORAL | 0 refills | Status: AC | PRN

## 2023-10-23 MED ORDER — LIDOCAINE HCL (PF) 2 % IJ SOLN
INTRAMUSCULAR | Status: AC
  Filled 2023-10-23: qty 5

## 2023-10-23 MED ORDER — ONDANSETRON HCL 4 MG/2ML IJ SOLN
INTRAMUSCULAR | Status: DC | PRN
  Administered 2023-10-23: 4 mg via INTRAVENOUS

## 2023-10-23 MED ORDER — ACETAMINOPHEN 10 MG/ML IV SOLN
INTRAVENOUS | Status: AC
  Filled 2023-10-23: qty 100

## 2023-10-23 MED ORDER — ONDANSETRON HCL 4 MG/2ML IJ SOLN: 4.0000 mg | Freq: Once | INTRAMUSCULAR | Status: DC | PRN

## 2023-10-23 MED ORDER — LACTATED RINGERS IV SOLN: INTRAVENOUS | Status: DC

## 2023-10-23 MED ORDER — DEXAMETHASONE SODIUM PHOSPHATE 10 MG/ML IJ SOLN
INTRAMUSCULAR | Status: DC | PRN
  Administered 2023-10-23: 10 mg via INTRAVENOUS

## 2023-10-23 MED ORDER — SODIUM CHLORIDE 0.9 % IV SOLN: INTRAVENOUS | Status: DC | PRN

## 2023-10-23 MED ORDER — PHENYLEPHRINE 80 MCG/ML (10ML) SYRINGE FOR IV PUSH (FOR BLOOD PRESSURE SUPPORT)
PREFILLED_SYRINGE | INTRAVENOUS | Status: AC
  Filled 2023-10-23: qty 10

## 2023-10-23 MED ORDER — OXYCODONE HCL 5 MG PO TABS
5.0000 mg | ORAL_TABLET | Freq: Once | ORAL | Status: AC | PRN
  Administered 2023-10-23: 5 mg via ORAL

## 2023-10-23 MED ORDER — PROPOFOL 10 MG/ML IV BOLUS
INTRAVENOUS | Status: DC | PRN
  Administered 2023-10-23: 50 mg via INTRAVENOUS
  Administered 2023-10-23: 150 mg via INTRAVENOUS

## 2023-10-23 MED ORDER — MIDAZOLAM HCL 2 MG/2ML IJ SOLN
INTRAMUSCULAR | Status: DC | PRN
  Administered 2023-10-23: 2 mg via INTRAVENOUS

## 2023-10-23 MED ORDER — FENTANYL CITRATE (PF) 100 MCG/2ML IJ SOLN
INTRAMUSCULAR | Status: DC | PRN
  Administered 2023-10-23: 50 ug via INTRAVENOUS
  Administered 2023-10-23 (×2): 25 ug via INTRAVENOUS

## 2023-10-23 MED ORDER — FENTANYL CITRATE (PF) 100 MCG/2ML IJ SOLN
INTRAMUSCULAR | Status: AC
  Filled 2023-10-23: qty 2

## 2023-10-23 MED ORDER — CHLORHEXIDINE GLUCONATE 0.12 % MT SOLN
15.0000 mL | Freq: Once | OROMUCOSAL | Status: AC
Start: 2023-10-23 — End: 2023-10-23
  Administered 2023-10-23: 15 mL via OROMUCOSAL

## 2023-10-23 MED ORDER — ONDANSETRON HCL 4 MG/2ML IJ SOLN
INTRAMUSCULAR | Status: AC
  Filled 2023-10-23: qty 2

## 2023-10-23 MED ORDER — FENTANYL CITRATE (PF) 100 MCG/2ML IJ SOLN: 25.0000 ug | INTRAMUSCULAR | Status: DC | PRN

## 2023-10-23 MED ORDER — DEXAMETHASONE SODIUM PHOSPHATE 10 MG/ML IJ SOLN
INTRAMUSCULAR | Status: AC
  Filled 2023-10-23: qty 1

## 2023-10-23 MED ORDER — EPHEDRINE SULFATE-NACL 50-0.9 MG/10ML-% IV SOSY
PREFILLED_SYRINGE | INTRAVENOUS | Status: DC | PRN
  Administered 2023-10-23 (×3): 5 mg via INTRAVENOUS

## 2023-10-23 MED ORDER — PROPOFOL 10 MG/ML IV BOLUS
INTRAVENOUS | Status: AC
  Filled 2023-10-23: qty 20

## 2023-10-23 MED ORDER — ACETAMINOPHEN 10 MG/ML IV SOLN
INTRAVENOUS | Status: DC | PRN
  Administered 2023-10-23: 1000 mg via INTRAVENOUS

## 2023-10-23 MED ORDER — LIDOCAINE HCL (CARDIAC) PF 100 MG/5ML IV SOSY
PREFILLED_SYRINGE | INTRAVENOUS | Status: DC | PRN
  Administered 2023-10-23: 100 mg via INTRAVENOUS

## 2023-10-23 MED ORDER — SODIUM CHLORIDE 0.9 % IR SOLN
Status: DC | PRN
  Administered 2023-10-23: 1500

## 2023-10-23 MED ORDER — EPHEDRINE 5 MG/ML INJ
INTRAVENOUS | Status: AC
  Filled 2023-10-23: qty 5

## 2023-10-23 MED ORDER — CHLORHEXIDINE GLUCONATE 0.12 % MT SOLN
OROMUCOSAL | Status: AC
  Filled 2023-10-23: qty 15

## 2023-10-23 MED ORDER — SILVER NITRATE-POT NITRATE 75-25 % EX MISC
CUTANEOUS | Status: DC | PRN
  Administered 2023-10-23: 2

## 2023-10-23 MED ORDER — MIDAZOLAM HCL 2 MG/2ML IJ SOLN
INTRAMUSCULAR | Status: AC
  Filled 2023-10-23: qty 2

## 2023-10-23 MED ORDER — ORAL CARE MOUTH RINSE: 15.0000 mL | Freq: Once | OROMUCOSAL | Status: AC

## 2023-10-23 MED ORDER — KETOROLAC TROMETHAMINE 30 MG/ML IJ SOLN
INTRAMUSCULAR | Status: AC
  Filled 2023-10-23: qty 1

## 2023-10-23 MED ORDER — OXYCODONE HCL 5 MG PO TABS
ORAL_TABLET | ORAL | Status: AC
  Filled 2023-10-23: qty 1

## 2023-10-23 MED ORDER — KETOROLAC TROMETHAMINE 30 MG/ML IJ SOLN
INTRAMUSCULAR | Status: DC | PRN
  Administered 2023-10-23: 30 mg via INTRAVENOUS

## 2023-10-23 MED ORDER — OXYCODONE HCL 5 MG/5ML PO SOLN: 5.0000 mg | Freq: Once | ORAL | Status: AC | PRN

## 2023-10-23 MED ORDER — PHENYLEPHRINE 80 MCG/ML (10ML) SYRINGE FOR IV PUSH (FOR BLOOD PRESSURE SUPPORT)
PREFILLED_SYRINGE | INTRAVENOUS | Status: DC | PRN
  Administered 2023-10-23: 80 ug via INTRAVENOUS

## 2023-10-23 SURGICAL SUPPLY — 27 items
BAG DRN RND TRDRP ANRFLXCHMBR (UROLOGICAL SUPPLIES)
BAG URINE DRAIN 2000ML AR STRL (UROLOGICAL SUPPLIES) IMPLANT
CATH FOLEY 2WAY 5CC 16FR (CATHETERS)
CATH URTH 16FR FL 2W BLN LF (CATHETERS) IMPLANT
DEVICE MYOSURE LITE (MISCELLANEOUS) IMPLANT
DEVICE MYOSURE REACH (MISCELLANEOUS) IMPLANT
DRSG TELFA 3X8 NADH STRL (GAUZE/BANDAGES/DRESSINGS) IMPLANT
ELECT REM PT RETURN 9FT ADLT (ELECTROSURGICAL) ×1
ELECTRODE REM PT RTRN 9FT ADLT (ELECTROSURGICAL) ×1 IMPLANT
GLOVE BIO SURGEON STRL SZ7 (GLOVE) ×1 IMPLANT
GLOVE BIOGEL PI IND STRL 7.5 (GLOVE) ×1 IMPLANT
GOWN STRL REUS W/ TWL LRG LVL3 (GOWN DISPOSABLE) ×2 IMPLANT
GOWN STRL REUS W/TWL LRG LVL3 (GOWN DISPOSABLE) ×2
IV NS IRRIG 3000ML ARTHROMATIC (IV SOLUTION) ×1 IMPLANT
KIT PROCEDURE FLUENT (KITS) ×1 IMPLANT
KIT TURNOVER CYSTO (KITS) ×1 IMPLANT
MANIFOLD NEPTUNE II (INSTRUMENTS) ×1 IMPLANT
PACK DNC HYST (MISCELLANEOUS) ×1 IMPLANT
PAD OB MATERNITY 4.3X12.25 (PERSONAL CARE ITEMS) ×1 IMPLANT
PAD PREP OB/GYN DISP 24X41 (PERSONAL CARE ITEMS) ×1 IMPLANT
SCRUB CHG 4% DYNA-HEX 4OZ (MISCELLANEOUS) ×1 IMPLANT
SEAL ROD LENS SCOPE MYOSURE (ABLATOR) ×1 IMPLANT
SET CYSTO W/LG BORE CLAMP LF (SET/KITS/TRAYS/PACK) IMPLANT
SURGILUBE 2OZ TUBE FLIPTOP (MISCELLANEOUS) ×1 IMPLANT
TRAP FLUID SMOKE EVACUATOR (MISCELLANEOUS) ×1 IMPLANT
TUBING CONNECTING 10 (TUBING) ×1 IMPLANT
WATER STERILE IRR 500ML POUR (IV SOLUTION) ×1 IMPLANT

## 2023-10-23 NOTE — Anesthesia Procedure Notes (Signed)
Procedure Name: LMA Insertion Date/Time: 10/23/2023 7:50 AM  Performed by: Morene Crocker, CRNAPre-anesthesia Checklist: Patient identified, Patient being monitored, Timeout performed, Emergency Drugs available and Suction available Patient Re-evaluated:Patient Re-evaluated prior to induction Oxygen Delivery Method: Circle system utilized Preoxygenation: Pre-oxygenation with 100% oxygen Induction Type: IV induction Ventilation: Mask ventilation without difficulty LMA: LMA inserted LMA Size: 4.0 Tube type: Oral Number of attempts: 1 Placement Confirmation: positive ETCO2 and breath sounds checked- equal and bilateral Tube secured with: Tape Dental Injury: Teeth and Oropharynx as per pre-operative assessment  Comments: Smooth atraumatic placement of LMA, no complications noted

## 2023-10-23 NOTE — Transfer of Care (Signed)
Immediate Anesthesia Transfer of Care Note  Patient: Morgan Summers  Procedure(s) Performed: DILATATION AND CURETTAGE /HYSTEROSCOPY / POLYPECTOMY (Uterus)  Patient Location: PACU  Anesthesia Type:General  Level of Consciousness: awake, alert , and oriented  Airway & Oxygen Therapy: Patient Spontanous Breathing  Post-op Assessment: Report given to RN and Post -op Vital signs reviewed and stable  Post vital signs: Reviewed and stable  Last Vitals:  Vitals Value Taken Time  BP 118/65 10/23/23 0838  Temp 36.2 C 10/23/23 0837  Pulse 75 10/23/23 0841  Resp 14 10/23/23 0841  SpO2 98 % 10/23/23 0841  Vitals shown include unfiled device data.  Last Pain:  Vitals:   10/23/23 0837  PainSc: 0-No pain         Complications: No notable events documented.

## 2023-10-23 NOTE — Anesthesia Preprocedure Evaluation (Signed)
Anesthesia Evaluation  Patient identified by MRN, date of birth, ID band Patient awake    Reviewed: Allergy & Precautions, NPO status , Patient's Chart, lab work & pertinent test results  Airway Mallampati: II  TM Distance: <3 FB Neck ROM: full    Dental  (+) Teeth Intact   Pulmonary neg pulmonary ROS   Pulmonary exam normal breath sounds clear to auscultation       Cardiovascular Exercise Tolerance: Good negative cardio ROS Normal cardiovascular exam Rhythm:Regular Rate:Normal     Neuro/Psych negative neurological ROS  negative psych ROS   GI/Hepatic negative GI ROS, Neg liver ROS,,,  Endo/Other  negative endocrine ROS    Renal/GU negative Renal ROS     Musculoskeletal negative musculoskeletal ROS (+)    Abdominal Normal abdominal exam  (+)   Peds negative pediatric ROS (+)  Hematology negative hematology ROS (+)   Anesthesia Other Findings Past Medical History: No date: Skin cancer     Comment:  shoulder  Past Surgical History: 11/11/2018: COLONOSCOPY WITH PROPOFOL; N/A     Comment:  Procedure: COLONOSCOPY WITH PROPOFOL;  Surgeon: Toney Reil, MD;  Location: [facility] ENDOSCOPY;  Service:               Gastroenterology;  Laterality: N/A; No date: TONSILLECTOMY No date: TUBAL LIGATION  BMI    Body Mass Index: 32.49 kg/m      Reproductive/Obstetrics negative OB ROS                             Anesthesia Physical Anesthesia Plan  ASA: 1  Anesthesia Plan: General   Post-op Pain Management:    Induction: Intravenous  PONV Risk Score and Plan: 1 and Ondansetron and Dexamethasone  Airway Management Planned: LMA  Additional Equipment:   Intra-op Plan:   Post-operative Plan: Extubation in OR  Informed Consent: I have reviewed the patients History and Physical, chart, labs and discussed the procedure including the risks, benefits and alternatives for  the proposed anesthesia with the patient or authorized representative who has indicated his/her understanding and acceptance.     Dental Advisory Given  Plan Discussed with: CRNA and Surgeon  Anesthesia Plan Comments:        Anesthesia Quick Evaluation

## 2023-10-23 NOTE — Op Note (Signed)
Operative Note   Name: Morgan Summers  Date of Service: 10/23/2023  DOB: 1966/05/03  MRN: [id number]   PRE-OP DIAGNOSIS:  1) Postmenopausal bleeding 2) Suspected endometrial polyp   POST-OP DIAGNOSIS:  1) Postmenopausal bleeding 2) Suspected endometrial polyp, no obvious polypoid lesion noted.   SURGEON: Surgeons and Role:    Conard Novak, MD - Primary  PROCEDURE: Procedure(s): DILATATION AND CURETTAGE /HYSTEROSCOPY / POLYPECTOMY   ANESTHESIA: General   ESTIMATED BLOOD LOSS: 5 mL  DRAINS: none   TOTAL IV FLUIDS: 650 mL  SPECIMENS:  Endometrial curettings  VTE PROPHYLAXIS: SCDs to the bilateral lower extremities  ANTIBIOTICS: none indicated  FLUID DEFICIT: 130 mL  COMPLICATIONS: none  DISPOSITION: PACU - hemodynamically stable.  CONDITION: stable  INDICATION: 57 y.o. G95P1001 female who presented with postmenopausal bleeding. She was found to have a thickened endometrial stripe and a suspected, small polypoid lesion of the endometrium. She was taken to the OR for biopsy and potential removal of the polypoid lesion.   FINDINGS: Exam under anesthesia revealed small, mobile anteverted uterus with no masses and bilateral adnexa without masses or fullness. Hysteroscopy revealed an essentially grossly normal appearing uterine cavity with bilateral tubal ostia and normal appearing endocervical canal. There were two or three very small lesions that were noted that could have represented the findings on ultrasound.   PROCEDURE IN DETAIL:  After informed consent was obtained, the patient was taken to the operating room where anesthesia was obtained without difficulty. The patient was positioned in the dorsal lithotomy position in [place] stirrups.  The patient's bladder was catheterized with an in and out foley catheter.  The patient was examined under anesthesia, with the above noted findings.  The bi-valved speculum was placed inside the patient's vagina, and the the anterior  lip of the cervix was grasped with the tenaculum.  Then the cervix was progressively dilated to a 7 mm Hegar dilator.  The hysteroscope was introduced, with the above noted findings. The MyoSure Reach device was utilized to remove all suspected focal lesions without difficulty.    The hystersocope was removed and the uterine cavity was curetted until a gritty texture was noted, yielding small-medium endometrial curettings.  The hysteroscope was re-introduced and the fluid pressure was gradually lowered to that of well below the MAP with hemostasis noted. The hysteroscope was removed. The tenaculum was removed.  Excellent hemostasis was noted after application of silver nitrate to the tenaculum entry sites and the cervical os had no bleeding. After verifying no sponges or instruments remained in the vagina, the speculum was removed. She was then taken out of dorsal lithotomy.  The patient tolerated the procedure well.  Sponge, lap and needle counts were correct x2.  The patient was taken to recovery room in excellent condition.  Conard Novak, MD, FACOG 10/23/2023 8:35 AM

## 2023-10-23 NOTE — Anesthesia Postprocedure Evaluation (Signed)
Anesthesia Post Note  Patient: Morgan Summers  Procedure(s) Performed: DILATATION AND CURETTAGE /HYSTEROSCOPY (Uterus)  Patient location during evaluation: PACU Anesthesia Type: General Level of consciousness: awake Pain management: satisfactory to patient Vital Signs Assessment: post-procedure vital signs reviewed and stable Respiratory status: spontaneous breathing Cardiovascular status: stable Anesthetic complications: no   No notable events documented.   Last Vitals:  Vitals:   10/23/23 0900 10/23/23 0909  BP: 134/76 120/73  Pulse: 63 (!) 59  Resp: 16 18  Temp: (!) 36.2 C 36.6 C  SpO2: 99% 97%    Last Pain:  Vitals:   10/23/23 0909  TempSrc: Temporal  PainSc: 2                  VAN STAVEREN,Autym Siess

## 2023-10-23 NOTE — H&P (Signed)
Preoperative History and Physical  Morgan Summers is a 57 y.o. G1P1001 here for surgical management of postmenopausal bleeding and suspected endometrial polyp.   No significant preoperative concerns.  History of Present Illness: 57 y.o. G84P1001 female who presents with postmenopausal bleeding. She noted that a couple of weeks ago she had some bleeding. This bleeding turned into a period. This lasted about 7 days. She has had hot flashes, especially at night, especially not as much in the past couple of months. She notes no significant vaginal dryness.  She denies unintended weight loss, new bloating, new constipation, no new early satiety.  She has never taken hormone replacement medications. She takes only a multivitamin.   Her last period was > 2 years ago.   09/03/2023: FSH was 12.8  LH was 14.6 Estrogen leel was 192  Last pap smear, 10/13/2023: NILM, HPV negative She had enrolled in a menopause study called [employer].   10/08/2023: Pelvic ultrasound Ultrasound demonstrates the following findings Adnexa: no masses seen  Uterus: anteverted with endometrial stripe 5.3 mm Additional: endometrial-myometrial junction: not defined. Endometrial thickness, total 5.3 mm  Polyp(s) Size 5 mm x 4 mm x 5 mm. Mean 4.7 mm. Smooth with internal vascularity   Proposed surgery: Hysteroscopy, dilation and curettage, endometrial polypectomy  Past Medical History:  Diagnosis Date   Skin cancer    shoulder   Past Surgical History:  Procedure Laterality Date   COLONOSCOPY WITH PROPOFOL N/A 11/11/2018   Procedure: COLONOSCOPY WITH PROPOFOL;  Surgeon: Toney Reil, MD;  Location: [facility] ENDOSCOPY;  Service: Gastroenterology;  Laterality: N/A;   TONSILLECTOMY     TUBAL LIGATION     OB History  Gravida Para Term Preterm AB Living  1 1 1     1   SAB IAB Ectopic Multiple Live Births          1    # Outcome Date GA Lbr Len/2nd Weight Sex Type Anes PTL Lv  1 Term 06/09/88    F Vag-Spont   LIV   Patient denies any other pertinent gynecologic issues.   No current facility-administered medications on file prior to encounter.   Current Outpatient Medications on File Prior to Encounter  Medication Sig Dispense Refill   ibuprofen (ADVIL) 200 MG tablet Take 600 mg by mouth every 8 (eight) hours as needed (pain.).     Multiple Vitamin (MULTIVITAMIN) tablet Take 1 tablet by mouth in the morning.     phenazopyridine (PYRIDIUM) 200 MG tablet Take 200 mg by mouth 3 (three) times daily as needed for pain. (Patient not taking: Reported on 10/19/2023)     No Known Allergies  Social History:   reports that she has never smoked. She has never used smokeless tobacco. She reports current alcohol use of about 3.0 standard drinks of alcohol per week. She reports that she does not use drugs.  Family History  Problem Relation Age of Onset   Breast cancer Neg Hx    Ovarian cancer Neg Hx    Colon cancer Neg Hx     Review of Systems: Noncontributory  PHYSICAL EXAM: Blood pressure 129/85, pulse 74, temperature 99.5 F (37.5 C), resp. rate 15, height 5\' 9"  (1.753 m), weight 99.8 kg, SpO2 96%. CONSTITUTIONAL: Well-developed, well-nourished female in no acute distress.  HENT:  Normocephalic, atraumatic, External right and left ear normal. Oropharynx is clear and moist EYES: Conjunctivae and EOM are normal. Pupils are equal, round, and reactive to light. No scleral icterus.  NECK: Normal range of motion,  supple, no masses SKIN: Skin is warm and dry. No rash noted. Not diaphoretic. No erythema. No pallor. NEUROLGIC: Alert and oriented to person, place, and time. Normal reflexes, muscle tone coordination. No cranial nerve deficit noted. PSYCHIATRIC: Normal mood and affect. Normal behavior. Normal judgment and thought content. CARDIOVASCULAR: Normal heart rate noted, regular rhythm RESPIRATORY: Effort and breath sounds normal, no problems with respiration noted ABDOMEN: Soft, nontender,  nondistended. PELVIC: Deferred MUSCULOSKELETAL: Normal range of motion. No edema and no tenderness. 2+ distal pulses.  Labs: Results for orders placed or performed during the hospital encounter of 10/23/23 (from the past 336 hour(s))  ABO/Rh   Collection Time: 10/23/23  6:47 AM  Result Value Ref Range   ABO/RH(D)      O POS Performed at [facility], [address] [place], [place] [address]   Results for orders placed or performed during the hospital encounter of 10/19/23 (from the past 336 hour(s))  CBC   Collection Time: 10/19/23 12:26 PM  Result Value Ref Range   WBC 6.3 4.0 - 10.5 K/uL   RBC 4.46 3.87 - 5.11 MIL/uL   Hemoglobin 13.6 12.0 - 15.0 g/dL   HCT 19.1 47.8 - 29.5 %   MCV 92.4 80.0 - 100.0 fL   MCH 30.5 26.0 - 34.0 pg   MCHC 33.0 30.0 - 36.0 g/dL   RDW 62.1 30.8 - 65.7 %   Platelets 337 150 - 400 K/uL   nRBC 0.0 0.0 - 0.2 %  Type and screen [facility]   Collection Time: 10/19/23 12:26 PM  Result Value Ref Range   ABO/RH(D) O POS    Antibody Screen NEG    Sample Expiration 11/02/2023,2359    Extend sample reason      NO TRANSFUSIONS OR PREGNANCY IN THE PAST 3 MONTHS Performed at [facility], [address] [place], [place] [address]     Imaging Studies: No results found.  Assessment: Postmenopausal bleeding Endometrial polyp  Plan: Patient will undergo surgical management with the above-noted surgery.   The risks of surgery were discussed in detail with the patient including but not limited to: bleeding which may require transfusion or reoperation; infection which may require antibiotics; injury to surrounding organs which may involve bowel, bladder, ureters ; need for additional procedures including laparoscopy or laparotomy; thromboembolic phenomenon, surgical site problems and other postoperative/anesthesia complications. Likelihood of success in alleviating the patient's condition was discussed. Routine  postoperative instructions will be reviewed with the patient and her family in detail after surgery.  The patient concurred with the proposed plan, giving informed written consent for the surgery.  Patient has been NPO since last night she will remain NPO for procedure.  Anesthesia and OR aware.  Preoperative prophylactic antibiotics, as indicated, and SCDs ordered on call to the OR.  To OR when ready.  Thomasene Mohair, MD 10/23/2023 7:41 AM

## 2023-10-26 LAB — SURGICAL PATHOLOGY

## 2023-10-27 ENCOUNTER — Other Ambulatory Visit: Payer: Self-pay | Admitting: Medical Genetics

## 2023-10-27 DIAGNOSIS — Z006 Encounter for examination for normal comparison and control in clinical research program: Secondary | ICD-10-CM
